# Patient Record
Sex: Male | Born: 1960 | Race: White | Hispanic: No | Marital: Single | Smoking: Never smoker
Health system: Southern US, Community
[De-identification: ages and names within clinical notes are randomized; demographics above are authoritative.]

## PROBLEM LIST (undated history)

## (undated) DIAGNOSIS — K2270 Barrett's esophagus without dysplasia: Secondary | ICD-10-CM

## (undated) DIAGNOSIS — F909 Attention-deficit hyperactivity disorder, unspecified type: Secondary | ICD-10-CM

## (undated) DIAGNOSIS — B1920 Unspecified viral hepatitis C without hepatic coma: Secondary | ICD-10-CM

## (undated) DIAGNOSIS — E785 Hyperlipidemia, unspecified: Secondary | ICD-10-CM

## (undated) DIAGNOSIS — S0990XA Unspecified injury of head, initial encounter: Secondary | ICD-10-CM

## (undated) DIAGNOSIS — K219 Gastro-esophageal reflux disease without esophagitis: Secondary | ICD-10-CM

## (undated) DIAGNOSIS — K5900 Constipation, unspecified: Secondary | ICD-10-CM

## (undated) HISTORY — DX: Constipation, unspecified: K59.00

## (undated) HISTORY — DX: Hyperlipidemia, unspecified: E78.5

## (undated) HISTORY — DX: Unspecified viral hepatitis C without hepatic coma: B19.20

## (undated) HISTORY — PX: ACHILLES TENDON REPAIR: SUR1153

## (undated) HISTORY — DX: Barrett's esophagus without dysplasia: K22.70

---

## 2011-06-15 ENCOUNTER — Emergency Department (HOSPITAL_COMMUNITY)

## 2011-06-15 ENCOUNTER — Inpatient Hospital Stay (HOSPITAL_COMMUNITY)
Admission: EM | Admit: 2011-06-15 | Discharge: 2011-06-16 | DRG: 069 | Attending: Internal Medicine | Admitting: Internal Medicine

## 2011-06-15 ENCOUNTER — Other Ambulatory Visit: Payer: Self-pay

## 2011-06-15 ENCOUNTER — Encounter (HOSPITAL_COMMUNITY): Payer: Self-pay | Admitting: *Deleted

## 2011-06-15 DIAGNOSIS — K219 Gastro-esophageal reflux disease without esophagitis: Secondary | ICD-10-CM | POA: Diagnosis present

## 2011-06-15 DIAGNOSIS — R279 Unspecified lack of coordination: Secondary | ICD-10-CM | POA: Diagnosis present

## 2011-06-15 DIAGNOSIS — R270 Ataxia, unspecified: Secondary | ICD-10-CM

## 2011-06-15 DIAGNOSIS — G459 Transient cerebral ischemic attack, unspecified: Principal | ICD-10-CM | POA: Diagnosis present

## 2011-06-15 DIAGNOSIS — Z23 Encounter for immunization: Secondary | ICD-10-CM

## 2011-06-15 DIAGNOSIS — R209 Unspecified disturbances of skin sensation: Secondary | ICD-10-CM | POA: Diagnosis present

## 2011-06-15 DIAGNOSIS — F909 Attention-deficit hyperactivity disorder, unspecified type: Secondary | ICD-10-CM | POA: Diagnosis present

## 2011-06-15 DIAGNOSIS — R51 Headache: Secondary | ICD-10-CM | POA: Diagnosis present

## 2011-06-15 HISTORY — DX: Gastro-esophageal reflux disease without esophagitis: K21.9

## 2011-06-15 HISTORY — DX: Attention-deficit hyperactivity disorder, unspecified type: F90.9

## 2011-06-15 HISTORY — DX: Unspecified injury of head, initial encounter: S09.90XA

## 2011-06-15 LAB — RAPID URINE DRUG SCREEN, HOSP PERFORMED
Amphetamines: NOT DETECTED
Barbiturates: NOT DETECTED
Tetrahydrocannabinol: NOT DETECTED

## 2011-06-15 LAB — DIFFERENTIAL
Basophils Absolute: 0 10*3/uL (ref 0.0–0.1)
Basophils Relative: 0 % (ref 0–1)
Eosinophils Relative: 0 % (ref 0–5)
Lymphocytes Relative: 16 % (ref 12–46)
Lymphs Abs: 1.3 10*3/uL (ref 0.7–4.0)
Neutro Abs: 6.7 10*3/uL (ref 1.7–7.7)
Neutrophils Relative %: 78 % — ABNORMAL HIGH (ref 43–77)

## 2011-06-15 LAB — COMPREHENSIVE METABOLIC PANEL
Albumin: 4.3 g/dL (ref 3.5–5.2)
BUN: 14 mg/dL (ref 6–23)
Calcium: 10.2 mg/dL (ref 8.4–10.5)
GFR calc Af Amer: >90 mL/min (ref >90)
Glucose, Bld: 114 mg/dL — ABNORMAL HIGH (ref 70–99)
Total Protein: 8.7 g/dL — ABNORMAL HIGH (ref 6.0–8.3)

## 2011-06-15 LAB — PROTIME-INR
INR: 1.1 (ref 0.00–1.49)
Prothrombin Time: 14.4 seconds (ref 11.6–15.2)

## 2011-06-15 LAB — APTT: aPTT: 29 seconds (ref 24–37)

## 2011-06-15 LAB — CBC
Hemoglobin: 15.9 g/dL (ref 13.0–17.0)
MCH: 31.9 pg (ref 26.0–34.0)
Platelets: 197 10*3/uL (ref 150–400)
RBC: 4.98 MIL/uL (ref 4.22–5.81)
WBC: 8.6 10*3/uL (ref 4.0–10.5)

## 2011-06-15 LAB — CARDIAC PANEL(CRET KIN+CKTOT+MB+TROPI)
CK, MB: 2.8 ng/mL (ref 0.3–4.0)
Total CK: 265 U/L — ABNORMAL HIGH (ref 7–232)

## 2011-06-15 MED ORDER — FAMOTIDINE 20 MG PO TABS
20.0000 mg | ORAL_TABLET | Freq: Two times a day (BID) | ORAL | Status: DC
  Administered 2011-06-15 – 2011-06-16 (×3): 20 mg via ORAL
  Filled 2011-06-15 (×3): qty 1

## 2011-06-15 MED ORDER — ONDANSETRON HCL 4 MG PO TABS: 4.0000 mg | ORAL_TABLET | Freq: Four times a day (QID) | ORAL | Status: DC | PRN

## 2011-06-15 MED ORDER — IBUPROFEN 800 MG PO TABS
800.0000 mg | ORAL_TABLET | Freq: Once | ORAL | Status: AC
  Administered 2011-06-15: 800 mg via ORAL
  Filled 2011-06-15: qty 1

## 2011-06-15 MED ORDER — ASPIRIN 325 MG PO TABS
325.0000 mg | ORAL_TABLET | Freq: Every day | ORAL | Status: DC
  Administered 2011-06-15 – 2011-06-16 (×2): 325 mg via ORAL
  Filled 2011-06-15 (×2): qty 1

## 2011-06-15 MED ORDER — SODIUM CHLORIDE 0.9 % IJ SOLN
INTRAMUSCULAR | Status: AC
  Administered 2011-06-15: 10 mL
  Filled 2011-06-15: qty 6

## 2011-06-15 MED ORDER — ONDANSETRON HCL 4 MG/2ML IJ SOLN: 4.0000 mg | Freq: Four times a day (QID) | INTRAMUSCULAR | Status: DC | PRN

## 2011-06-15 MED ORDER — HEPARIN SODIUM (PORCINE) 5000 UNIT/ML IJ SOLN
5000.0000 [IU] | Freq: Three times a day (TID) | INTRAMUSCULAR | Status: DC
  Administered 2011-06-15 – 2011-06-16 (×3): 5000 [IU] via SUBCUTANEOUS
  Filled 2011-06-15 (×7): qty 1

## 2011-06-15 MED ORDER — SODIUM CHLORIDE 0.9 % IV SOLN
INTRAVENOUS | Status: DC
  Administered 2011-06-15: 20 mL/h via INTRAVENOUS
  Administered 2011-06-15: 14:00:00 via INTRAVENOUS

## 2011-06-15 MED ORDER — ACETAMINOPHEN 325 MG PO TABS
650.0000 mg | ORAL_TABLET | Freq: Four times a day (QID) | ORAL | Status: DC | PRN
  Administered 2011-06-15: 650 mg via ORAL
  Filled 2011-06-15 (×2): qty 2

## 2011-06-15 NOTE — H&P (Signed)
Skip Litke MRN: [id number] DOB/AGE: 1961-04-26 51 y.o.  Admit date: 06/15/2011 Chief Complaint: Right arm numbness, unsteadiness of gait. HPI: This 51 year old man, who is currently in prison, presents with the above symptoms which started at 2:45 AM today. He noticed his gait was unsteady and he was veering to the right side. Also, he noticed some numbness in the right arm although did not describe frank weakness. He also describes some abnormal sensation in the back of the right head. There was no blurred vision or double vision and his speech was normal. He describes as if he was feeling drunk. His symptoms lasted for the next 3-4 hours and now have resolved. His workup in the emergency room so far shows a normal MRI with no evidence of CVA. He does not really have any risk factors for cerebrovascular disease.    1. Past medical history: 1. Gastroesophageal reflux disease. 2. Attention deficit disorder with hyperactivity.  Past surgical history: 1. Achilles tendon repair.      Family history: Noncontributory. In particular, there is no family history of cerebrovascular or cardiovascular disease.  Social history: He is currently in prison. He does not smoke cigarettes nor does he drink alcohol. He is married.  Allergies:  Allergies  Allergen Reactions  . Other     "cough med that starts with a c"    Medications Prior to Admission  Medication Dose Route Frequency Provider Last Rate Last Dose  . 0.9 %  sodium chloride infusion   Intravenous Continuous Candis Musa, PA 20 mL/hr at 06/15/11 0853 20 mL/hr at 06/15/11 0853  . aspirin tablet 325 mg  325 mg Oral Daily Candis Musa, PA   325 mg at 06/15/11 0854  . famotidine (PEPCID) tablet 20 mg  20 mg Oral BID Gazella Anglin C Nioma Mccubbins, MD      . heparin injection 5,000 Units  5,000 Units Subcutaneous Q8H Lynda Wanninger C Jamil Castillo, MD      . ibuprofen (ADVIL,MOTRIN) tablet 800 mg  800 mg Oral Once Candis Musa, PA   800 mg at 06/15/11 1114  .  ondansetron (ZOFRAN) tablet 4 mg  4 mg Oral Q6H PRN Mila Pair C Colburn Asper, MD       Or  . ondansetron (ZOFRAN) injection 4 mg  4 mg Intravenous Q6H PRN Katalin Colledge Normajean Glasgow, MD       No current outpatient prescriptions on file as of 06/15/2011.       NWG:NFAOZ from the symptoms mentioned above,there are no other symptoms referable to all systems reviewed.  Physical Exam: Blood pressure 124/52, pulse 60, temperature 97.9 F (36.6 C), temperature source Oral, resp. rate 20, height 5\' 11"  (1.803 m), weight 95.255 kg (210 lb), SpO2 97.00%. He looks systemically well and is physically well built and looks extremely fit. Heart sounds are present and normal without murmurs. There are no carotid bruits. Lung fields are clear. Abdomen is soft nontender nontender. Neurologically, he is alert and orientated. His cognition is completely normal. His speech is normal. External ocular movements are normal. There are no cranial nerve abnormalities. There are no cerebellar signs. His strength in both upper and lower limbs are normal.    Basename 06/15/11 0842  WBC 8.6  NEUTROABS 6.7  HGB 15.9  HCT 44.4  MCV 89.2  PLT 197    Basename 06/15/11 0842  NA 138  K 4.3  CL 102  CO2 28  GLUCOSE 114*  BUN 14  CREATININE 0.97  CALCIUM 10.2  MG --  Mr Brain Wo Contrast  06/15/2011  *RADIOLOGY REPORT*  Clinical Data: 51 year old male with right-sided weakness, numbness.  MRI HEAD WITHOUT CONTRAST  Technique:  Multiplanar, multiecho pulse sequences of the brain and surrounding structures were obtained according to standard protocol without intravenous contrast.  Comparison: None.  Findings: No restricted diffusion to suggest acute infarction.  No midline shift, mass effect, evidence of mass lesion, ventriculomegaly, extra-axial collection or acute intracranial hemorrhage.  Cervicomedullary junction and pituitary are within normal limits.  Major intracranial vascular flow voids are preserved. Wallace Cullens and  white matter signal is within normal limits for age throughout the brain.  Negative visualized cervical spine.  Visualized bone marrow signal is within normal limits.  Visualized paranasal sinuses and mastoids are clear.  Visualized orbit soft tissues are within normal limits.  Negative scalp soft tissues.  IMPRESSION: No acute intracranial abnormality.  Negative for age noncontrast MRI appearance the brain.  Original Report Authenticated By: Harley Hallmark, M.D.   Impression: 1. Possible TIA affecting the left brain versus cerebellum. MRI brain scan entirely normal.     Plan: 1. Admit. 2. 2-D echocardiogram and ultrasound carotids. 3. Hopefully, he can be discharged soon once the above workup is complete.      Wilson Singer Pager [phone]  06/15/2011, 12:48 PM

## 2011-06-15 NOTE — ED Notes (Signed)
Woke up with numbness to head and right side with difficulty walking. Had a cup of coffee, vomited and one episode of loose stool. Pt received dramamine, tylenol, and a CTM. Prior to arrival to ED.  Has sinus trouble often per pt.

## 2011-06-15 NOTE — ED Provider Notes (Signed)
Patient's symptoms are concerning for a TIA. At this time he has a normal neurologic exam. He has equal strength. There are no cranial nerve deficits noted. Patient has normal sensation.his MRI does not show an acute stroke I will consult the medicine service for admission for an expedited TIA workup. Patient currently is incarcerated and it made it difficult for him to have an outpatient workup.  Medical screening examination/treatment/procedure(s) were conducted as a shared visit with non-physician practitioner(s) and myself.  I personally evaluated the patient during the encounter   Celene Kras, MD 06/15/11 1023

## 2011-06-15 NOTE — ED Provider Notes (Signed)
History     CSN: [id number]  Arrival date & time 06/15/11  7829   First MD Initiated Contact with Patient 06/15/11 [phone]      Chief Complaint  Patient presents with  . Numbness    (Consider location/radiation/quality/duration/timing/severity/associated sxs/prior treatment) HPI Comments: Patient presents for further evaluation of numbness in his right upper extremity and his posterior neck and head which he first noticed when he woke at 245.  This morning.  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  He got out of bed and felt dizzy, so laid back down for a few minutes without improvement.  He proceeded to walk down the hallway and noticed he was leaning towards the right and felt extremely dizzy.  He reports feeling "like I was drunk.".  He also had a mild posterior headache which has been persistent.  He had one episode of nausea with emesis and also had a large watery bowel movement about 2 hours after his symptoms began.  His nausea has resolved, and his only remaining symptom at this time is numbness across his posterior scalp and a mild posterior headache.  He was given Dramamine prior to arrival.  The dizziness lasted for more than 2 hours.  He denies any prior similar symptoms.  The history is provided by the patient.    Past Medical History  Diagnosis Date  . GERD (gastroesophageal reflux disease)   . ADD (attention deficit disorder with hyperactivity)   . Head injury     Past Surgical History  Procedure Date  . Achilles tendon repair     No family history on file.  History  Substance Use Topics  . Smoking status: Never Smoker   . Smokeless tobacco: Not on file  . Alcohol Use: No      Review of Systems  Constitutional: Negative for fever.  HENT: Negative for congestion, sore throat and neck pain.   Eyes: Negative.   Respiratory: Negative for chest tightness and shortness of breath.   Cardiovascular: Negative for  chest pain.  Gastrointestinal: Negative for nausea and abdominal pain.  Genitourinary: Negative.   Musculoskeletal: Negative for joint swelling and arthralgias.  Skin: Negative.  Negative for rash and wound.  Neurological: Positive for dizziness and numbness. Negative for seizures, speech difficulty, weakness, light-headedness and headaches.  Hematological: Negative.   Psychiatric/Behavioral: Negative.     Allergies  Other  Home Medications  No current outpatient prescriptions on file.  BP 122/60  Pulse 55  Temp(Src) 97.5 F (36.4 C) (Oral)  Resp 16  Ht 5\' 11"  (1.803 m)  Wt 210 lb (95.255 kg)  BMI 29.29 kg/m2  SpO2 97%  Physical Exam  Nursing note and vitals reviewed. Constitutional: He is oriented to person, place, and time. He appears well-developed and well-nourished.  HENT:  Head: Normocephalic and atraumatic.  Eyes: Conjunctivae are normal.  Neck: Normal range of motion.  Cardiovascular: Normal rate, regular rhythm, normal heart sounds and intact distal pulses.   Pulmonary/Chest: Effort normal and breath sounds normal. He has no wheezes.  Abdominal: Soft. Bowel sounds are normal. There is no tenderness.  Musculoskeletal: Normal range of motion.  Neurological: He is alert and oriented to person, place, and time. He has normal strength and normal reflexes. He displays normal reflexes. No cranial nerve deficit or sensory deficit. He exhibits normal muscle tone. He displays a negative Romberg sign. Coordination and gait normal. GCS eye subscore is 4. GCS  verbal subscore is 5. GCS motor subscore is 6.       Normal rapid alternating movements.  Patient ambulated with no evidence of ataxia.  Skin: Skin is warm and dry.  Psychiatric: He has a normal mood and affect.    ED Course  Procedures (including critical care time)   Labs Reviewed  COMPREHENSIVE METABOLIC PANEL  PROTIME-INR  APTT  CARDIAC PANEL(CRET KIN+CKTOT+MB+TROPI)  URINE RAPID DRUG SCREEN (HOSP PERFORMED)    CBC  DIFFERENTIAL   No results found.   No diagnosis found.    MDM  ABCD score of 4 given patient's symptoms and duration.  MRI, negative, cannot rule  out TIA.  At this time.  Recommended admission, which patient is agreeable to.     Date: 06/15/2011  Rate: 55  Rhythm: sinus bradycardia  QRS Axis: normal  Intervals: normal  ST/T Wave abnormalities: normal  Conduction Disutrbances:none  Narrative Interpretation:   Old EKG Reviewed: none available      Candis Musa, PA 06/15/11 1036

## 2011-06-15 NOTE — ED Provider Notes (Signed)
Medical screening examination/treatment/procedure(s) were performed by non-physician practitioner and as supervising physician I was immediately available for consultation/collaboration.   Celene Kras, MD 06/15/11 [phone]

## 2011-06-15 NOTE — ED Notes (Signed)
Guard sitting with patient

## 2011-06-15 NOTE — Progress Notes (Signed)
*  PRELIMINARY RESULTS* Echocardiogram 2D Echocardiogram has been performed.  Conrad  06/15/2011, 3:23 PM

## 2011-06-15 NOTE — ED Notes (Signed)
Pt states he had a feeling of being drunk this am. Pt states he has had this in past with a recurrent inner ear build up of wax.

## 2011-06-16 LAB — CBC
Platelets: 174 10*3/uL (ref 150–400)
RDW: 12.2 % (ref 11.5–15.5)
WBC: 5.4 10*3/uL (ref 4.0–10.5)

## 2011-06-16 LAB — COMPREHENSIVE METABOLIC PANEL
Alkaline Phosphatase: 63 U/L (ref 39–117)
BUN: 13 mg/dL (ref 6–23)
CO2: 29 mEq/L (ref 19–32)
Chloride: 104 mEq/L (ref 96–112)
GFR calc Af Amer: 88 mL/min — ABNORMAL LOW (ref >90)
GFR calc non Af Amer: 76 mL/min — ABNORMAL LOW (ref >90)
Glucose, Bld: 91 mg/dL (ref 70–99)
Potassium: 4.2 mEq/L (ref 3.5–5.1)
Total Bilirubin: 0.6 mg/dL (ref 0.3–1.2)
Total Protein: 7.4 g/dL (ref 6.0–8.3)

## 2011-06-16 LAB — LIPID PANEL
Cholesterol: 141 mg/dL (ref 0–200)
HDL: 35 mg/dL — ABNORMAL LOW (ref >39)
Triglycerides: 91 mg/dL (ref <150)

## 2011-06-16 MED ORDER — ASPIRIN 325 MG PO TABS: 325.0000 mg | ORAL_TABLET | Freq: Every day | ORAL | Status: AC

## 2011-06-16 MED ORDER — INFLUENZA VIRUS VACC SPLIT PF IM SUSP
0.5000 mL | INTRAMUSCULAR | Status: DC
  Filled 2011-06-16: qty 0.5

## 2011-06-16 NOTE — Progress Notes (Signed)
CARE MANAGEMENT NOTE 06/16/2011  Patient:  Rick Livingston, Rick Livingston   Account Number:  [id number]  Date Initiated:  06/16/2011  Documentation initiated by:  Rosemary Holms  Subjective/Objective Assessment:   pt admitted with TIA's. PTA lived in correctional facility.     Action/Plan:   To be discharged back to correctional facility with no needs identified.   Anticipated DC Date:  06/16/2011   Anticipated DC Plan:  [facility]      DC Planning Services  CM consult      Choice offered to / List presented to:             Status of service:  Completed, signed off Medicare Important Message given?   (If response is "NO", the following Medicare IM given date fields will be blank) Date Medicare IM given:   Date Additional Medicare IM given:    Discharge Disposition:  [facility]  Per UR Regulation:    Comments:  06/16/11 1200 Hopelynn Gartland Leanord Hawking RN [facility] of corrections notified of DC.

## 2011-06-16 NOTE — Discharge Summary (Signed)
Physician Discharge Summary  Patient ID: Rick Livingston MRN: [id number] DOB/AGE: [birth year] 51 y.o.  Admit date: 06/15/2011 Discharge date: 06/16/2011  Primary Care Physician:  No primary provider on file., patient is in a correctional facility   Discharge Diagnoses:    Principal Problem:  *TIA (transient ischemic attack) GERD   Medication List  As of 06/16/2011  9:52 AM   STOP taking these medications         ibuprofen 200 MG tablet         TAKE these medications         aspirin 325 MG tablet   Take 1 tablet (325 mg total) by mouth daily.      mulitivitamin with minerals Tabs   Take 1 tablet by mouth daily.      ranitidine 150 MG tablet   Commonly known as: ZANTAC   Take 150 mg by mouth 2 (two) times daily as needed. For acid reflux             Disposition and Follow-up:  Follow up with MD at correctional facility  Consults:  none   Significant Diagnostic Studies:  Mr Brain Wo Contrast  06/15/2011  *RADIOLOGY REPORT*  Clinical Data: 51 year old male with right-sided weakness, numbness.  MRI HEAD WITHOUT CONTRAST  Technique:  Multiplanar, multiecho pulse sequences of the brain and surrounding structures were obtained according to standard protocol without intravenous contrast.  Comparison: None.  Findings: No restricted diffusion to suggest acute infarction.  No midline shift, mass effect, evidence of mass lesion, ventriculomegaly, extra-axial collection or acute intracranial hemorrhage.  Cervicomedullary junction and pituitary are within normal limits.  Major intracranial vascular flow voids are preserved. Wallace Cullens and white matter signal is within normal limits for age throughout the brain.  Negative visualized cervical spine.  Visualized bone marrow signal is within normal limits.  Visualized paranasal sinuses and mastoids are clear.  Visualized orbit soft tissues are within normal limits.  Negative scalp soft tissues.  IMPRESSION: No acute intracranial abnormality.   Negative for age noncontrast MRI appearance the brain.  Original Report Authenticated By: Harley Hallmark, M.D.   US Carotid Duplex Bilateral  06/15/2011  *RADIOLOGY REPORT*  Clinical Data: TIA, right-sided weakness.  BILATERAL CAROTID DUPLEX ULTRASOUND  Technique: Wallace Cullens scale imaging, color Doppler and duplex ultrasound was performed of bilateral carotid and vertebral arteries in the neck.  Comparison: None  Criteria:  Quantification of carotid stenosis is based on velocity parameters that correlate the residual internal carotid diameter with NASCET-based stenosis levels, using the diameter of the distal internal carotid lumen as the denominator for stenosis measurement.  The following velocity measurements were obtained:                   PEAK SYSTOLIC/END DIASTOLIC RIGHT ICA:                          69/22 cm/sec CCA:                        86/16cm/sec SYSTOLIC ICA/CCA RATIO:     0.81 DIASTOLIC ICA/CCA RATIO:    1.41 ECA:                        100cm/sec  LEFT ICA:                        64/30cm/sec CCA:  85/17cm/sec SYSTOLIC ICA/CCA RATIO:     1.38 DIASTOLIC ICA/CCA RATIO:    1.06 ECA:                        114cm/sec  Findings:  RIGHT CAROTID ARTERY: Intimal thickening in the common carotid artery bulb.  No focal plaque accumulation or stenosis.  Normal wave forms and color Doppler signal.  RIGHT VERTEBRAL ARTERY:  Normal flow direction and waveform.  LEFT CAROTID ARTERY: Mild smooth intimal thickening in the common carotid artery bulb.  No focal plaque accumulation or stenosis. Normal wave forms and color Doppler signal.  LEFT VERTEBRAL ARTERY:  Normal flow direction and waveform.  IMPRESSION:  1.  No significant carotid bifurcation plaque or stenosis.  Original Report Authenticated By: Osa Craver, M.D.    Brief H and P: For complete details please refer to admission H and P, but in brief This 51 year old man, who is currently in prison, presents with the above symptoms  which started at 2:45 AM today. He noticed his gait was unsteady and he was veering to the right side. Also, he noticed some numbness in the right arm although did not describe frank weakness. He also describes some abnormal sensation in the back of the right head. There was no blurred vision or double vision and his speech was normal. He describes as if he was feeling drunk. His symptoms lasted for the next 3-4 hours and now have resolved. His workup in the emergency room so far shows a normal MRI with no evidence of CVA. He does not really have any risk factors for cerebrovascular disease.     Hospital Course:  Principal Problem:  *TIA (transient ischemic attack)  Patient was admitted to the hospital for TIA work up.  MRI of the brain was found to be negative.  Carotid ultrasound did not show any significant stenosis bilaterally.  2D echo has been done, with report currently pending.  Lipid panel did not show an elevated LDL. Clinically the patient reports that his symptoms have resolved.  He no longer has any right arm numbness and does not feel weak on this right side.  Patient has not been taking any regular aspirin.  We will start him on a daily aspirin for preventative measures.  He is normotensive, but will need his blood pressures periodically monitored.  He is felt stable for discharge back to the correctional facility today  Time spent on Discharge:  Signed: [facility] Pager: [phone] 06/16/2011, 9:52 AM

## 2011-06-16 NOTE — Discharge Instructions (Signed)
Transient Ischemic Attack A transient ischemic attack (TIA) is a "warning stroke" that causes stroke-like symptoms. Unlike a stroke, a TIA does not cause permanent damage to the brain. The symptoms of a TIA can happen very fast and do not last long. It is important to know the symptoms of a TIA and what to do. This can help prevent a major stroke or death. CAUSES   A TIA is caused by a temporary blockage in an artery in the brain or neck (carotid artery). The blockage does not allow the brain to get the blood supply it needs and can cause different symptoms. The blockage can be caused by either:   A blood clot.   Fatty buildup (plaque) in a neck or brain artery.  SYMPTOMS  TIA symptoms are the same as a stroke but are temporary. Symptoms can include sudden:  Numbness or weakness on one side of the body. Especially to the:   Face.   Arm.   Leg.   Trouble speaking, thinking, or confusion.   Change in vision, such as trouble seeing in one or both eyes.   Dizziness, loss of balance, or difficulty walking.   Severe headache.  ANY OF THESE SYMPTOMS MAY REPRESENT A SERIOUS PROBLEM THAT IS AN EMERGENCY. Do not wait to see if the symptoms will go away. Get medical help at once. Call your local emergency services (911 in [place] IMMEDIATELY. DO NOT drive yourself to the hospital. RISK FACTORS Risk factors can increase the risk of developing a TIA. These can include.   High blood pressure (hypertension).   High cholesterol (hyperlipidemia).   Heart disease (atherosclerosis).   Smoking.   Diabetes.   Abnormal heart rhythm (atrial fibrillation).   Family history of a stroke or heart attack.   Use of oral contraceptives (especially when combined with smoking).  DIAGNOSIS   A TIA can be diagnosed based on your:   Symptoms.   History.   Risk factors.   Tests that can help diagnose the symptoms of a TIA include:   CT or MRI scan. These tests can provide detailed images of the  brain.   Carotid ultrasound. This test looks to see if there are blockages in the carotid arteries of your neck.   Arteriography. A thin, small flexible tube (catheter) is inserted through a small cut (incision) in your groin. The catheter is threaded to your carotid or vertebral artery. A dye is then injected into the catheter. The dye highlights the arteries in your brain and allows your caregiver to look for narrowing or blockages that can cause a TIA.  TREATMENT  Based on the cause of a TIA, treatment options can vary. Treatment is important to help prevent a stroke. Treatment options can include:  Medication. Such as:   Clot-busting medicine.   Anti-platelet medicine.   Blood pressure medicine.   Blood thinner medicine.   Surgery:   Carotid endarterectomy. The carotid arteries are the arteries that supply the head and neck with oxygenated blood. This surgery can help remove fatty deposits (plaque) in the carotid arteries.   Angioplasty and stenting. This surgery uses a balloon to dilate a blocked artery in the brain. A stent is a small, metal mesh tube that can help keep an artery open  HOME CARE INSTRUCTIONS   It is important to take all medicine as told by your caregiver. If the medicine has side effects that affect you negatively, tell your caregiver right away. Do not stop taking medicine unless told   by your caregiver. Some medicines may need to be changed to better treat your condition.   Do not smoke. Talk to your caregiver on how to quit smoking.   Eat a diet high in fruits, vegetables and lean meat. Avoid a high fat, high salt diet. A dietician can you help you make healthy food choices.   Maintain a healthy weight. Develop an exercise plan approved by your caregiver.  SEEK IMMEDIATE MEDICAL CARE IF:   You develop weakness or numbness on one side of your body.   You have problems thinking, speaking, or feel confused.   You have vision changes.   You feel dizzy,  have trouble walking, or lose your balance.   You develop a severe headache.  MAKE SURE YOU:   Understand these instructions.   Will watch your condition.   Will get help right away if you are not doing well or get worse.  Document Released: 01/28/2005 Document Revised: 12/31/2010 Document Reviewed: 06/13/2009 [facility] Patient Information 2012 [place], [place].

## 2011-06-16 NOTE — Progress Notes (Signed)
[facility] to [facility] with [facility] D/c instructions sent with guards Left floor via wheelchair, accompanied by staff  No c/o pain at d/c  Patient will be followed by medical staff at prison [facility]. Rick Livingston

## 2012-08-30 ENCOUNTER — Emergency Department (HOSPITAL_BASED_OUTPATIENT_CLINIC_OR_DEPARTMENT_OTHER): Payer: BC Managed Care – PPO

## 2012-08-30 ENCOUNTER — Emergency Department (HOSPITAL_BASED_OUTPATIENT_CLINIC_OR_DEPARTMENT_OTHER)
Admission: EM | Admit: 2012-08-30 | Discharge: 2012-08-30 | Disposition: A | Discharge status: Home / Self Care | Payer: BC Managed Care – PPO | Attending: Emergency Medicine | Admitting: Emergency Medicine

## 2012-08-30 ENCOUNTER — Encounter (HOSPITAL_BASED_OUTPATIENT_CLINIC_OR_DEPARTMENT_OTHER): Payer: Self-pay | Admitting: *Deleted

## 2012-08-30 DIAGNOSIS — R209 Unspecified disturbances of skin sensation: Secondary | ICD-10-CM | POA: Insufficient documentation

## 2012-08-30 DIAGNOSIS — Z79899 Other long term (current) drug therapy: Secondary | ICD-10-CM | POA: Insufficient documentation

## 2012-08-30 DIAGNOSIS — Y9389 Activity, other specified: Secondary | ICD-10-CM | POA: Insufficient documentation

## 2012-08-30 DIAGNOSIS — K219 Gastro-esophageal reflux disease without esophagitis: Secondary | ICD-10-CM | POA: Insufficient documentation

## 2012-08-30 DIAGNOSIS — Y9289 Other specified places as the place of occurrence of the external cause: Secondary | ICD-10-CM | POA: Insufficient documentation

## 2012-08-30 DIAGNOSIS — W138XXA Fall from, out of or through other building or structure, initial encounter: Secondary | ICD-10-CM | POA: Insufficient documentation

## 2012-08-30 DIAGNOSIS — Z87828 Personal history of other (healed) physical injury and trauma: Secondary | ICD-10-CM | POA: Insufficient documentation

## 2012-08-30 DIAGNOSIS — W19XXXA Unspecified fall, initial encounter: Secondary | ICD-10-CM

## 2012-08-30 DIAGNOSIS — Z8659 Personal history of other mental and behavioral disorders: Secondary | ICD-10-CM | POA: Insufficient documentation

## 2012-08-30 DIAGNOSIS — S161XXA Strain of muscle, fascia and tendon at neck level, initial encounter: Secondary | ICD-10-CM

## 2012-08-30 DIAGNOSIS — Y990 Civilian activity done for income or pay: Secondary | ICD-10-CM | POA: Insufficient documentation

## 2012-08-30 DIAGNOSIS — S0990XA Unspecified injury of head, initial encounter: Secondary | ICD-10-CM | POA: Insufficient documentation

## 2012-08-30 DIAGNOSIS — S139XXA Sprain of joints and ligaments of unspecified parts of neck, initial encounter: Secondary | ICD-10-CM | POA: Insufficient documentation

## 2012-08-30 MED ORDER — OXYCODONE-ACETAMINOPHEN 5-325 MG PO TABS: 1.0000 | ORAL_TABLET | ORAL | Status: DC | PRN

## 2012-08-30 NOTE — ED Provider Notes (Signed)
History     CSN: [id number]  Arrival date & time 08/30/12  1550   First MD Initiated Contact with Patient 08/30/12 1556      Chief Complaint  Patient presents with  . Neck Pain     Patient is a 52 y.o. male presenting with neck pain. The history is provided by the patient.  Neck Pain Pain location:  L side Quality:  Aching Pain radiates to:  Does not radiate Pain severity:  Moderate Onset quality:  Sudden Duration: 1-2 hours ago. Timing:  Constant Progression:  Unchanged Chronicity:  New Context: fall   Relieved by:  Position Exacerbated by: movement, swallowing. Associated symptoms: headaches and numbness   Associated symptoms: no chest pain, no syncope and no weakness    Pt reports he fell about 10 feet while working.  He reports when he fell he landed on the back of his head and neck.  No LOC but he reports persistent pain in head and neck.  He reports he had immediate numbness in palms of both hands but that has improved.  He reports that it hurts to swallow but he is able to speak and handle secretions without difficulty.    He denies cp/sob.  No abd pain.  No leg pain is reported Past Medical History  Diagnosis Date  . GERD (gastroesophageal reflux disease)   . ADD (attention deficit disorder with hyperactivity)   . Head injury     Past Surgical History  Procedure Laterality Date  . Achilles tendon repair      No family history on file.  History  Substance Use Topics  . Smoking status: Never Smoker   . Smokeless tobacco: Not on file  . Alcohol Use: No      Review of Systems  HENT: Positive for neck pain.   Cardiovascular: Negative for chest pain and syncope.  Neurological: Positive for numbness and headaches. Negative for weakness.  All other systems reviewed and are negative.    Allergies  Other  Home Medications   Current Outpatient Rx  Name  Route  Sig  Dispense  Refill  . Multiple Vitamin (MULITIVITAMIN WITH MINERALS) TABS   Oral  Take 1 tablet by mouth daily.         . ranitidine (ZANTAC) 150 MG tablet   Oral   Take 150 mg by mouth 2 (two) times daily as needed. For acid reflux           BP 125/80  Pulse 55  Temp(Src) 98 F (36.7 C) (Oral)  Resp 20  SpO2 97%  Physical Exam CONSTITUTIONAL: Well developed/well nourished HEAD: Normocephalic/atraumatic EYES: EOMI/PERRL ENMT: Mucous membranes moist. No evidence of facial/nasal trauma. No stridor.  No drooling noted SPINE:  cervical spinal and paraspinal tenderness.  No bruising/crepitance/stepoffs noted to spine. Cervical collar in place CV: S1/S2 noted, no murmurs/rubs/gallops noted LUNGS: Lungs are clear to auscultation bilaterally, no apparent distress ABDOMEN: soft, nontender, no rebound or guarding GU:no cva tenderness NEURO: Pt is awake/alert, moves all extremitiesx4. GCS 15.  Equal hand grips noted, equal power with elbow flex/extension and shoulder abduction EXTREMITIES: pulses normal, full ROM. All other extremities/joints palpated/ranged and nontender SKIN: warm, color normal PSYCH: no abnormalities of mood noted  ED Course  Procedures 4:10 PM Pt seen on arrival.  He is here for fall that occurred at work.  He reports landing on neck and back.  He is in no distress but he does have significant neck pain.  His neck pain is  distracting him from the rest of his spinal exam, so will also image thoracic and lumbar spine.  We will keep him in full CTL precautions with c-collar in place.  He arrived by private car and was sitting in chair on my arrival to room.  He was helped to bed and kept in strict spinal precautions   No fracture noted by CT imaging Pt has no neuro deficits in his upper or lower extremities Equal hand grips, equal elbow flex/extension and shoulder abduction He has no chest or abdominal tenderness I advised him to continue to wear cervical collar as ligamentous injury is not ruled out.  Also, he did have paresthesias when the  injury occurred but that has improved.  I advised no work until re-examined within 5 days and f/u info given He has no anterior neck soft tissue swelling/bruising.  He can swallow without difficulty.  He has mild pain at left mandibular angle but no bony tenderness, deformity or stridor is noted.  No thrill noted over carotid.  Ct imaging of cspine (d/w radiology) does not reveal any obvious soft tissue swelling Stable for d/c  MDM  Nursing notes including past medical history and social history reviewed and considered in documentation xrays reviewed and considered         Joya Gaskins, MD 08/30/12 1806

## 2012-08-30 NOTE — ED Notes (Signed)
Fell 10 feet through a metal roof landing on the back of his head onto dirt ground. His hands went numb immediatly. On arrival to ED he is having trouble swallowing. c collar applied.

## 2012-08-31 ENCOUNTER — Encounter: Payer: Self-pay | Admitting: Family Medicine

## 2012-08-31 ENCOUNTER — Ambulatory Visit (INDEPENDENT_AMBULATORY_CARE_PROVIDER_SITE_OTHER): Payer: BC Managed Care – PPO | Admitting: Family Medicine

## 2012-08-31 VITALS — BP 110/74 | HR 78 | Ht 71.0 in | Wt 198.0 lb

## 2012-08-31 DIAGNOSIS — S199XXA Unspecified injury of neck, initial encounter: Secondary | ICD-10-CM

## 2012-09-01 ENCOUNTER — Encounter: Payer: Self-pay | Admitting: Family Medicine

## 2012-09-01 DIAGNOSIS — S199XXA Unspecified injury of neck, initial encounter: Secondary | ICD-10-CM | POA: Insufficient documentation

## 2012-09-01 NOTE — Assessment & Plan Note (Signed)
CT of cervical spine negative.  Of concern is that he had numbness in both hands when he struck the ground - concern for cervical spinal cord contusion vs transient spinal cord injury.  Advised he wear cervical collar at all times, will move forward with an MRI of cervical spine to assess spinal cord, patency of canal, ensure no ligamentous disruption.  Will call him with results.  If this is negative would consider PT, nsaids, muscle relaxants.

## 2012-09-01 NOTE — Progress Notes (Addendum)
Subjective:    Patient ID: Rick Livingston, male    DOB: [birth year], 52 y.o.   MRN: [id number]  PCP: None  HPI 52 yo M here for head, neck injuries.  Patient reports on 4/29 he was working on a metal roof about 10 feet off the ground. Part of the roof collapsed while he was crawling on it and he fell to ground onto back of head while head was tucked. Immediately had numbness/tingling in both hands for a couple seconds. No loss of consciousness, vomiting, vision changes, speech changes, other neurologic complaints. Felt like where he hit head was numb initially. Does have pain on left side of neck below jaw only when swallowing, feels better to press on this area. Went to ED on his own and had CTs of cervical, thoracic, lumbar spine that were negative for a fracture. Is wearing a cervical collar though has taken it off last night to bathe. Has continued to work. No bowel/bladder dysfunction.  Past Medical History  Diagnosis Date  . GERD (gastroesophageal reflux disease)   . ADD (attention deficit disorder with hyperactivity)   . Head injury     Current Outpatient Prescriptions on File Prior to Visit  Medication Sig Dispense Refill  . Multiple Vitamin (MULITIVITAMIN WITH MINERALS) TABS Take 1 tablet by mouth daily.      Marland Kitchen oxyCODONE-acetaminophen (PERCOCET/ROXICET) 5-325 MG per tablet Take 1 tablet by mouth every 4 (four) hours as needed for pain.  15 tablet  0  . ranitidine (ZANTAC) 150 MG tablet Take 150 mg by mouth 2 (two) times daily as needed. For acid reflux       No current facility-administered medications on file prior to visit.    Past Surgical History  Procedure Laterality Date  . Achilles tendon repair      Allergies  Allergen Reactions  . Other     "cough med that starts with a c"    History   Social History  . Marital Status: Married    Spouse Name: N/A    Number of Children: N/A  . Years of Education: N/A   Occupational History  . Not on file.    Social History Main Topics  . Smoking status: Never Smoker   . Smokeless tobacco: Not on file  . Alcohol Use: No  . Drug Use: No  . Sexually Active: Not on file   Other Topics Concern  . Not on file   Social History Narrative  . No narrative on file    Family History  Problem Relation Age of Onset  . Sudden death Neg Hx   . Hypertension Neg Hx   . Heart attack Neg Hx   . Diabetes Neg Hx   . Hyperlipidemia Neg Hx     BP 110/74  Pulse 78  Ht 5\' 11"  (1.803 m)  Wt 198 lb (89.812 kg)  BMI 27.63 kg/m2  Review of Systems See HPI above.    Objective:   Physical Exam Gen: NAD  Neuro: CN 2-12 grossly intact. EOMI. Pharynx symmetric. All extremities with 5/5 strength, reflexes 2+ upper and lower extremities. No numbness/tingling.  No laceration, contusion of back of skull or palpable deformity where he fell.  Neck: No gross deformity, swelling, bruising. TTP paraspinal muscles.  No focal bony tenderness. Did not test ROM of neck. BUE strength 5/5.   Sensation intact to light touch.   2+ equal reflexes in triceps, biceps, brachioradialis tendons. NV intact distal BUEs.    Assessment &  Plan:  1. Neck injury - CT of cervical spine negative.  Of concern is that he had numbness in both hands when he struck the ground - concern for cervical spinal cord contusion vs transient spinal cord injury.  Advised he wear cervical collar at all times, will move forward with an MRI of cervical spine to assess spinal cord, patency of canal, ensure no ligamentous disruption.  Will call him with results.  If this is negative would consider PT, nsaids, muscle relaxants.  Addendum 5/5:  Patient's MRI report reviewed and discussed with patient.  He doesn't have evidence of ligamentous disruption, central cord narrowing, increased signal of spinal cord that would be related to his injury to cause symptoms into both hands.  He does however have a disc protrusion at C5-6 but to the right side.   Neurologically he's intact currently.  Declined PT, nsaids, other medication for now and will call us if he doesn't continue to improve over next few weeks.  Discontinue cervical collar - only use as needed.  Will scan MRI report into chart.

## 2012-09-01 NOTE — Patient Instructions (Addendum)
Get MRI tomorrow as directed. I will call you with results and how to proceed.

## 2013-03-22 IMAGING — US US CAROTID DUPLEX BILAT
1 series · 13 of 24 positions shown · non-contrast
Comparison: None

CLINICAL DATA: TIA, right-sided weakness.

BILATERAL CAROTID DUPLEX ULTRASOUND
TECHNIQUE: Gray scale imaging, color Doppler and duplex ultrasound
was performed of bilateral carotid and vertebral arteries in the
neck.

[Series 1: us carotid duplex bilat · 0.07mm/px · 13 of 69 slices shown]
[im 1/69]
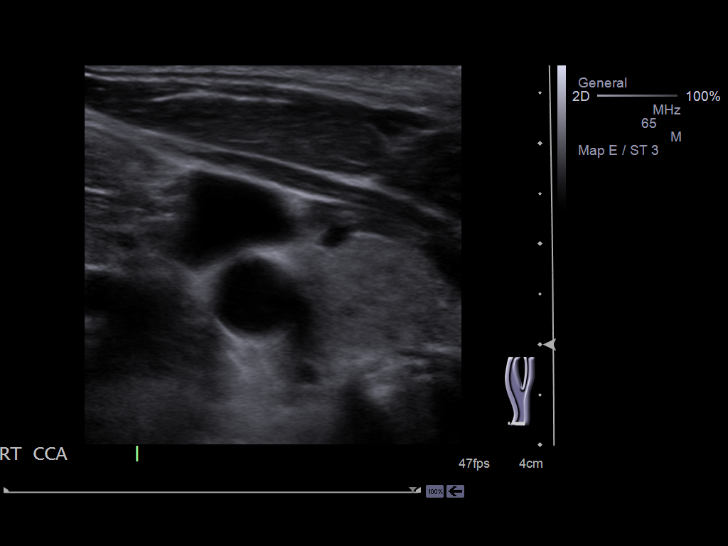
[im 6/69]
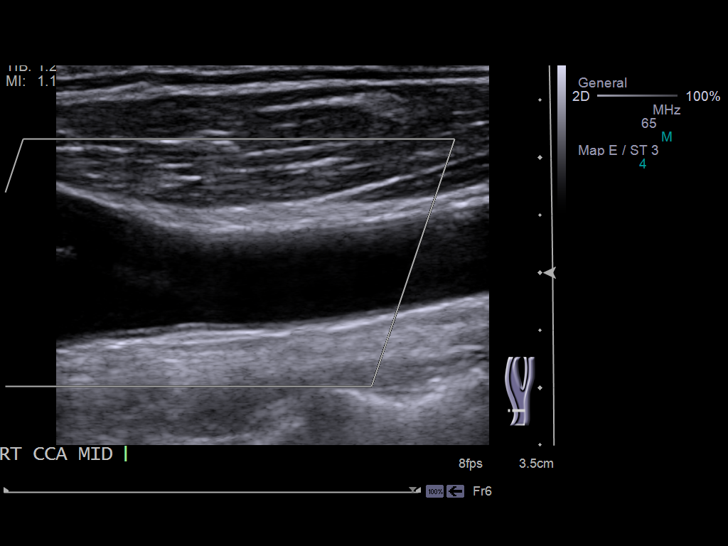
[im 12/69]
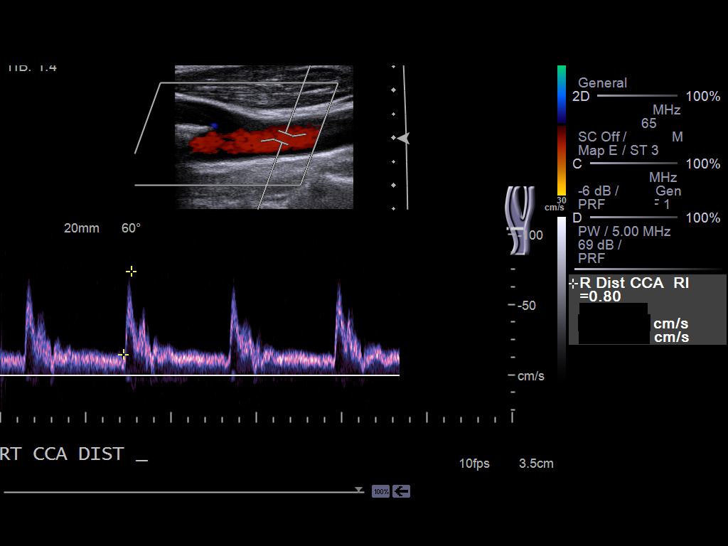
[im 18/69]
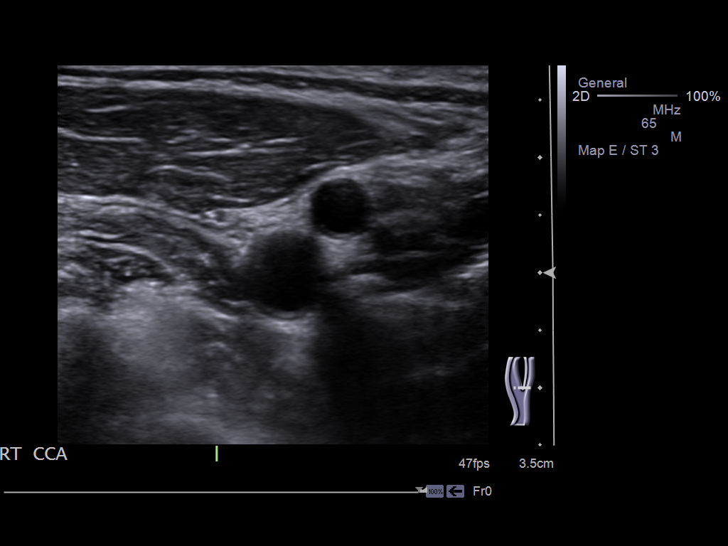
[im 24/69]
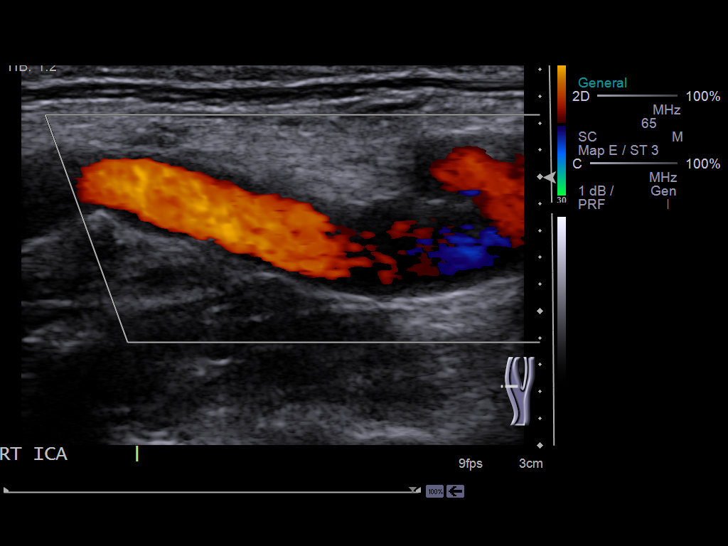
[im 30/69]
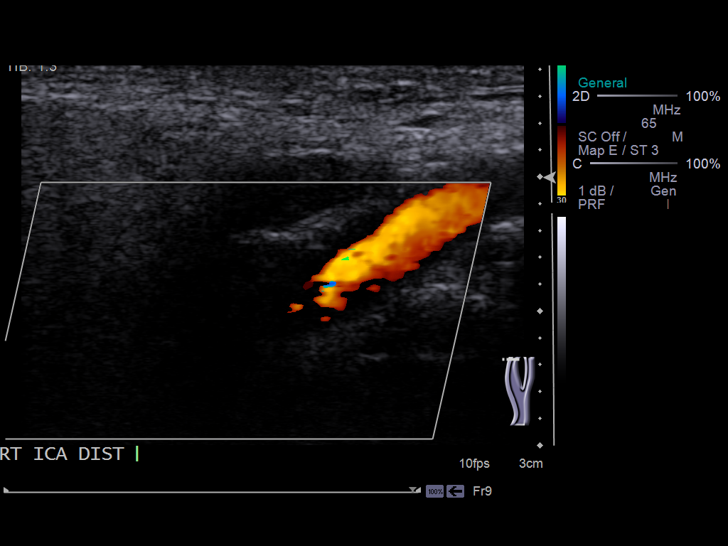
[im 36/69]
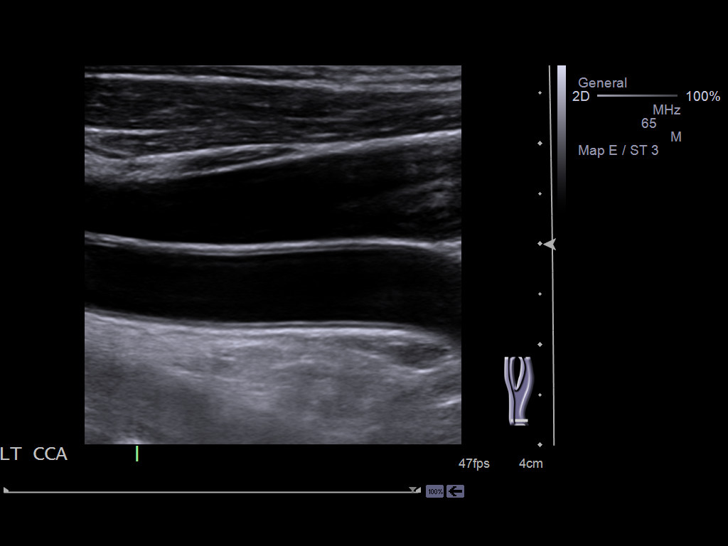
[im 39/69]
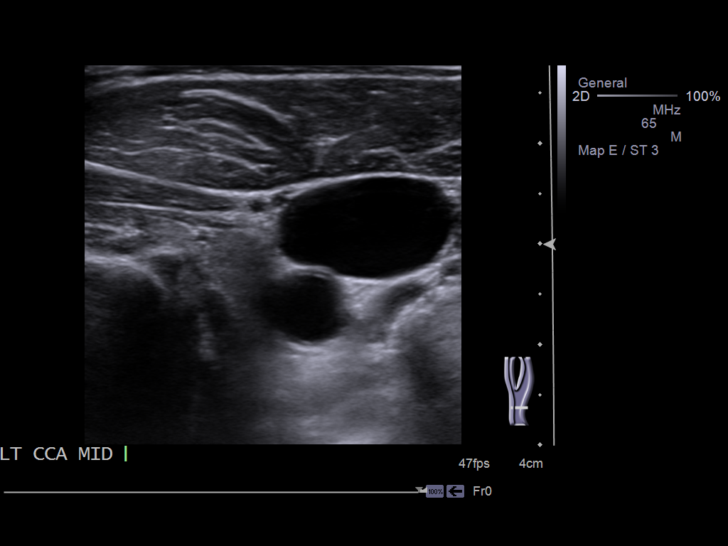
[im 45/69]
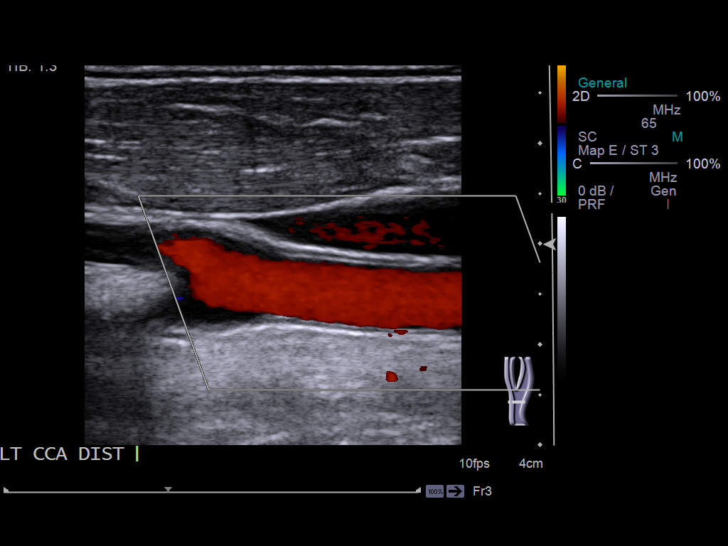
[im 51/69]
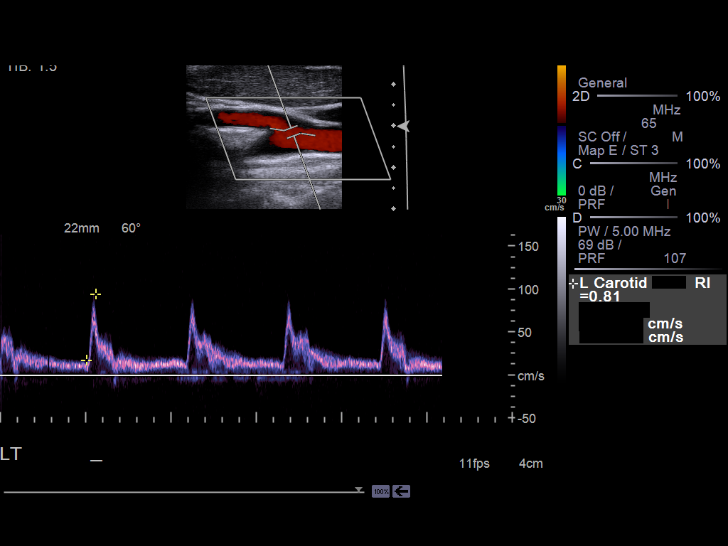
[im 57/69]
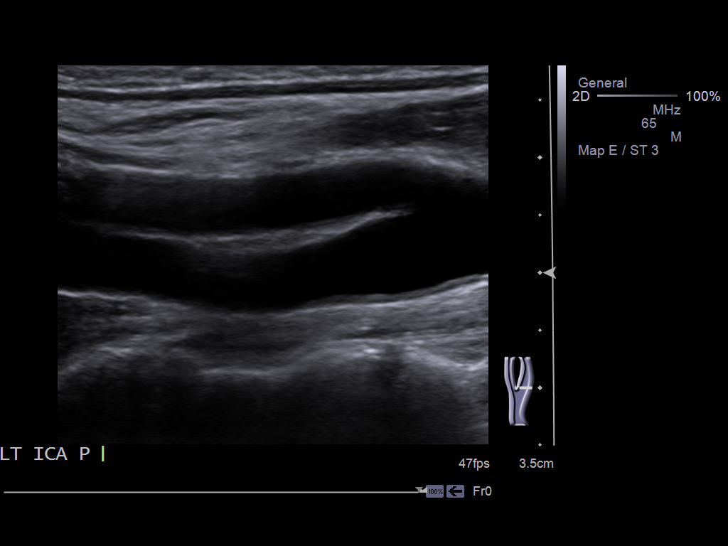
[im 63/69]
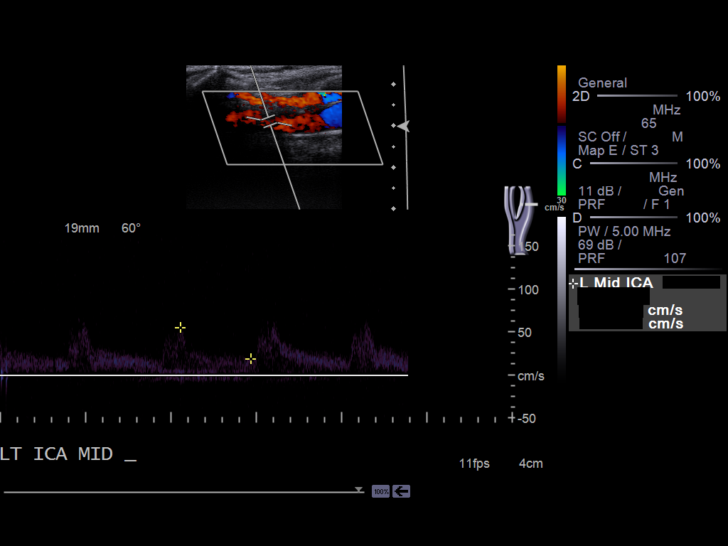
[im 69/69]
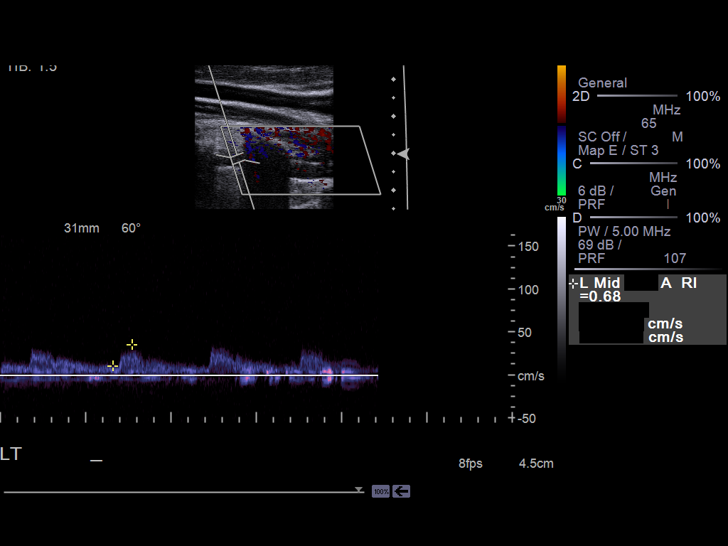

[13 of 24 positions shown; findings below may reference images not displayed]

Criteria:  Quantification of carotid stenosis is based on velocity
parameters that correlate the residual internal carotid diameter
with NASCET-based stenosis levels, using the diameter of the distal
internal carotid lumen as the denominator for stenosis measurement.

The following velocity measurements were obtained:

                 PEAK SYSTOLIC/END DIASTOLIC
RIGHT
ICA:                          69/22 cm/sec
CCA:                        86/16cm/sec
SYSTOLIC ICA/CCA RATIO:
DIASTOLIC ICA/CCA RATIO:
ECA:                        100cm/sec

LEFT
ICA:                        64/30cm/sec
CCA:                        85/17cm/sec
SYSTOLIC ICA/CCA RATIO:
DIASTOLIC ICA/CCA RATIO:
ECA:                        114cm/sec
FINDINGS: RIGHT CAROTID ARTERY: Intimal thickening in the common carotid
artery bulb.  No focal plaque accumulation or stenosis.  Normal
wave forms and color Doppler signal.

RIGHT VERTEBRAL ARTERY:  Normal flow direction and waveform.

LEFT CAROTID ARTERY: Mild smooth intimal thickening in the common
carotid artery bulb.  No focal plaque accumulation or stenosis.
Normal wave forms and color Doppler signal.

LEFT VERTEBRAL ARTERY:  Normal flow direction and waveform.
IMPRESSION: 1.  No significant carotid bifurcation plaque or stenosis.

## 2015-05-24 ENCOUNTER — Emergency Department (HOSPITAL_COMMUNITY): Payer: BLUE CROSS/BLUE SHIELD

## 2015-05-24 ENCOUNTER — Encounter (HOSPITAL_COMMUNITY): Payer: Self-pay | Admitting: *Deleted

## 2015-05-24 ENCOUNTER — Emergency Department (HOSPITAL_COMMUNITY)
Admission: EM | Admit: 2015-05-24 | Discharge: 2015-05-24 | Disposition: A | Discharge status: Home / Self Care | Payer: BLUE CROSS/BLUE SHIELD | Attending: Emergency Medicine | Admitting: Emergency Medicine

## 2015-05-24 DIAGNOSIS — Y998 Other external cause status: Secondary | ICD-10-CM | POA: Insufficient documentation

## 2015-05-24 DIAGNOSIS — Z8659 Personal history of other mental and behavioral disorders: Secondary | ICD-10-CM | POA: Insufficient documentation

## 2015-05-24 DIAGNOSIS — K219 Gastro-esophageal reflux disease without esophagitis: Secondary | ICD-10-CM | POA: Insufficient documentation

## 2015-05-24 DIAGNOSIS — S81032A Puncture wound without foreign body, left knee, initial encounter: Secondary | ICD-10-CM | POA: Insufficient documentation

## 2015-05-24 DIAGNOSIS — Y9389 Activity, other specified: Secondary | ICD-10-CM | POA: Insufficient documentation

## 2015-05-24 DIAGNOSIS — Z79899 Other long term (current) drug therapy: Secondary | ICD-10-CM | POA: Insufficient documentation

## 2015-05-24 DIAGNOSIS — Z7982 Long term (current) use of aspirin: Secondary | ICD-10-CM | POA: Diagnosis not present

## 2015-05-24 DIAGNOSIS — W458XXA Other foreign body or object entering through skin, initial encounter: Secondary | ICD-10-CM | POA: Insufficient documentation

## 2015-05-24 DIAGNOSIS — Y9289 Other specified places as the place of occurrence of the external cause: Secondary | ICD-10-CM | POA: Insufficient documentation

## 2015-05-24 DIAGNOSIS — T148XXA Other injury of unspecified body region, initial encounter: Secondary | ICD-10-CM

## 2015-05-24 MED ORDER — CEPHALEXIN 500 MG PO CAPS: 500.0000 mg | ORAL_CAPSULE | Freq: Four times a day (QID) | ORAL | Status: DC

## 2015-05-24 NOTE — ED Provider Notes (Signed)
CSN: [id number]     Arrival date & time 05/24/15  1942 History  By signing my name below, I, Rick Livingston, attest that this documentation has been prepared under the direction and in the presence of [employer], PA-C. Electronically Signed: Placido Livingston, ED Scribe. 05/24/2015. 8:34 PM.    Chief Complaint  Patient presents with  . Puncture Wound  . Wound Infection   The history is provided by the patient. No language interpreter was used.    HPI Comments: Rick Livingston is a 55 y.o. male who presents to the Emergency Department complaining of a irritated puncture wound above his left knee that occurred 2 days ago. He notes that a 1 inch nail was sticking out of a wood plank which fell onto the affected region and punctured the skin. He notes associated, moderate, left knee pain, redness and difficulty moving the affected joint. He confirms his TDAP is UTD. Pt denies any other associated symptoms at this time.    Past Medical History  Diagnosis Date  . GERD (gastroesophageal reflux disease)   . ADD (attention deficit disorder with hyperactivity)   . Head injury    Past Surgical History  Procedure Laterality Date  . Achilles tendon repair     Family History  Problem Relation Age of Onset  . Sudden death Neg Hx   . Hypertension Neg Hx   . Heart attack Neg Hx   . Diabetes Neg Hx   . Hyperlipidemia Neg Hx    Social History  Substance Use Topics  . Smoking status: Never Smoker   . Smokeless tobacco: None  . Alcohol Use: No    Review of Systems  All other systems reviewed and are negative.   Allergies  Other  Home Medications   Prior to Admission medications   Medication Sig Start Date End Date Taking? Authorizing Provider  aspirin 81 MG chewable tablet Chew 81 mg by mouth daily.    Historical Provider, MD  Multiple Vitamin (MULITIVITAMIN WITH MINERALS) TABS Take 1 tablet by mouth daily.    Historical Provider, MD  oxyCODONE-acetaminophen  (PERCOCET/ROXICET) 5-325 MG per tablet Take 1 tablet by mouth every 4 (four) hours as needed for pain. 08/30/12   Zadie Rhine, MD  ranitidine (ZANTAC) 150 MG tablet Take 150 mg by mouth 2 (two) times daily as needed. For acid reflux    Historical Provider, MD   BP 125/75 mmHg  Pulse 66  Temp(Src) 97.9 F (36.6 C) (Oral)  Resp 20  Wt 198 lb (89.812 kg)  SpO2 97%    Physical Exam  Constitutional: He is oriented to person, place, and time. He appears well-developed and well-nourished. No distress.  HENT:  Head: Normocephalic and atraumatic.  Eyes: Conjunctivae are normal. Right eye exhibits no discharge. Left eye exhibits no discharge. No scleral icterus.  Cardiovascular: Normal rate.   Pulmonary/Chest: Effort normal.  Musculoskeletal: Normal range of motion.  Evidence of previous puncture wound on the anterior aspect of left knee. Mild redness surrounding the puncture wound. No edema. No streaking. No warmth. Patient is able to flex and extend left knee without difficulty. No sign of muscle or tendon rupture. Patient ambulatory without difficulty.  Neurological: He is alert and oriented to person, place, and time. Coordination normal.  Strength 5/5 throughout. No sensory deficits.  No gait abnormality.  Skin: Skin is warm and dry. No rash noted. He is not diaphoretic. No erythema. No pallor.  Psychiatric: He has a normal mood and affect. His  behavior is normal.  Nursing note and vitals reviewed.   ED Course  Procedures  DIAGNOSTIC STUDIES: Oxygen Saturation is 97% on RA, normal by my interpretation.    COORDINATION OF CARE: 8:33 PM Pt presents today due to an irritated puncture wound to his left knee. Discussed next steps with pt including a DG of the affected region and reevaluation based on results of the imaging. Pt agreed to plan.  Labs Review Labs Reviewed - No data to display  Imaging Review Dg Knee Complete 4 Views Left  05/24/2015  CLINICAL DATA:  Puncture wound to  left knee 2 days ago EXAM: LEFT KNEE - COMPLETE 4+ VIEW COMPARISON:  None. FINDINGS: Four views of the left knee submitted. No acute fracture or subluxation. Mild prepatellar soft tissue swelling. No radiopaque foreign body. IMPRESSION: No acute fracture or subluxation. Mild prepatellar soft tissue swelling. Electronically Signed   By: Natasha Mead M.D.   On: 05/24/2015 21:20   I have personally reviewed and evaluated these images as part of my medical decision-making.   EKG Interpretation None      MDM   Final diagnoses:  Puncture wound   Otherwise healthy 55 year old male presents for puncture wound on left anterior knee that occurred 2 days ago after a wooden plank with a nail fell on his left knee. She has mild erythema around puncture wound site. Patient able to flex and extend his knee without difficulty. No obvious swelling or edema. Doubt septic joint. Patient afebrile. Knee is not warm or hot to the touch. No streaking. Patient's tetanus is up-to-date. X-ray obtained which reveals no acute fracture or subluxation. There is mild prepatellar soft tissue swelling which is consistent with clinical exam. No foreign body seen. We'll discharge home with antibiotics and pain medication. She will follow up with PCP this week for reevaluation. Return precautions outlined in patient discharge instructions. Patient is hemodynamically stable and ready for discharge.  I personally performed the services described in this documentation, which was scribed in my presence. The recorded information has been reviewed and is accurate.     Lester Kinsman [place], PA-C 05/25/15 2317  Richardean Canal, MD 05/27/15 [phone]

## 2015-05-24 NOTE — ED Notes (Signed)
Patient transported to x-ray. ?

## 2015-05-24 NOTE — ED Notes (Signed)
Patient reports a wood plank falling down on the top of his L knee. Wood plank had a 16 pinning nail in it, patient states about a inch went into the top of his knee, just above the kneecap. Patient still has full mobility of leg and knee, but reports pain with flexion and extension. Knee shows signs of infections - redness and slight swelling around the nail insertion.

## 2015-05-24 NOTE — Discharge Instructions (Signed)
Follow-up with her primary care provider for reevaluation. Apply ice to affected area. Take anabiotic as prescribed. Take ibuprofen as needed for pain. Return to the emergency department if you notice redness or swelling around the wound, and ability to flex knee, severe increasing ear pain, fever

## 2015-05-24 NOTE — ED Notes (Signed)
Patient verbalized understanding of discharge instructions and denies any further needs or questions at this time. VS stable. Patient ambulatory with steady gait.  

## 2015-05-24 NOTE — ED Notes (Signed)
Pt states he had a nail go into his left leg just above the left knee two days ago. Pt finished his work yesterday and worked all day today. Pt now presents with redness and tenderness, no drainage noted. Last tetanus in December

## 2015-07-10 HISTORY — PX: COLONOSCOPY: SHX174

## 2016-11-23 HISTORY — PX: ESOPHAGOGASTRODUODENOSCOPY: SHX1529

## 2017-12-07 ENCOUNTER — Telehealth: Payer: Self-pay | Admitting: Gastroenterology

## 2017-12-07 MED ORDER — DEXLANSOPRAZOLE 60 MG PO CPDR: 60.0000 mg | DELAYED_RELEASE_CAPSULE | Freq: Every day | ORAL | 0 refills | Status: DC

## 2017-12-07 NOTE — Telephone Encounter (Signed)
Sent refill to patients pharmacy and made patient appointment for additional refills.

## 2017-12-31 ENCOUNTER — Encounter: Payer: Self-pay | Admitting: Gastroenterology

## 2018-01-13 ENCOUNTER — Ambulatory Visit: Payer: BLUE CROSS/BLUE SHIELD | Admitting: Gastroenterology

## 2018-01-20 ENCOUNTER — Telehealth: Payer: Self-pay

## 2018-01-20 MED ORDER — DEXLANSOPRAZOLE 60 MG PO CPDR: 60.0000 mg | DELAYED_RELEASE_CAPSULE | Freq: Every day | ORAL | 0 refills | Status: DC

## 2018-01-20 NOTE — Telephone Encounter (Signed)
Sent refill to patients pharmacy. 

## 2018-01-27 ENCOUNTER — Ambulatory Visit (INDEPENDENT_AMBULATORY_CARE_PROVIDER_SITE_OTHER): Payer: Self-pay | Admitting: Gastroenterology

## 2018-01-27 ENCOUNTER — Encounter: Payer: Self-pay | Admitting: Gastroenterology

## 2018-01-27 VITALS — BP 160/100 | HR 86 | Ht 71.0 in | Wt 215.1 lb

## 2018-01-27 DIAGNOSIS — K22719 Barrett's esophagus with dysplasia, unspecified: Secondary | ICD-10-CM

## 2018-01-27 DIAGNOSIS — K219 Gastro-esophageal reflux disease without esophagitis: Secondary | ICD-10-CM

## 2018-01-27 MED ORDER — DEXLANSOPRAZOLE 60 MG PO CPDR: 60.0000 mg | DELAYED_RELEASE_CAPSULE | Freq: Every day | ORAL | 11 refills | Status: DC

## 2018-01-27 NOTE — Patient Instructions (Addendum)
If you are age 58 or older, your body mass index should be between 23-30. Your Body mass index is 30 kg/m. If this is out of the aforementioned range listed, please consider follow up with your Primary Care Provider.  If you are age 43 or younger, your body mass index should be between 19-25. Your Body mass index is 30 kg/m. If this is out of the aformentioned range listed, please consider follow up with your Primary Care Provider.   We have sent the following medications to your pharmacy for you to pick up at your convenience: Dexilant 60 mg once daily.   You will be due for a recall colonoscopy in 07/2020. We will send you a reminder in the mail when it gets closer to that time.  You will be due for a recall endoscopy in 11/2019. We will send you a reminder in the mail when it gets closer to that time.   Thank you,  Dr. Lynann Bologna

## 2018-01-27 NOTE — Progress Notes (Signed)
IMPRESSION and PLAN:    #1.GERD with history of Barrett's esophagus (last EGD 11/2016- neg Bx) -Continue Dexilant 60 mg p.o. once a day.  Samples and coupons were given. -Nonpharmacologic means of reflux control. -Next EGD 11/2019. -Follow-up in 1 year, earlier in case of any problems.      HPI:    Chief Complaint:   Rick Livingston is a 57 y.o. male  Follow-up Here for medication refill No nausea, vomiting, heartburn, regurgitation, odynophagia or dysphagia.  No significant diarrhea or constipation.  There is no melena or hematochezia. No unintentional weight loss. Has been paying over $100 for Dexilant every month. I have explained long-term side effects of PPIs. He starts having breakthrough symptoms if he misses even a single dose and is not ready to come off.  Past GI procedures: -EGD as above -Colonoscopy 07/2015 mild sigmoid diverticulosis, highly redundant colon difficult to examine.  Recommended repeating colonoscopy in 5 years due to the anatomy.   Past Medical History:  Diagnosis Date  . ADD (attention deficit disorder with hyperactivity)   . Barrett's esophagus   . Constipation   . GERD (gastroesophageal reflux disease)   . Head injury   . Hepatitis C virus   . Hyperlipemia     Current Outpatient Medications  Medication Sig Dispense Refill  . aspirin 81 MG chewable tablet Chew 81 mg by mouth daily.    . cephALEXin (KEFLEX) 500 MG capsule Take 1 capsule (500 mg total) by mouth 4 (four) times daily. 20 capsule 0  . dexlansoprazole (DEXILANT) 60 MG capsule Take 1 capsule (60 mg total) by mouth daily. 30 capsule 0  . Multiple Vitamin (MULITIVITAMIN WITH MINERALS) TABS Take 1 tablet by mouth daily.    Marland Kitchen oxyCODONE-acetaminophen (PERCOCET/ROXICET) 5-325 MG per tablet Take 1 tablet by mouth every 4 (four) hours as needed for pain. 15 tablet 0  . ranitidine (ZANTAC) 150 MG tablet Take 150 mg by mouth 2 (two) times daily as needed. For acid reflux     No current  facility-administered medications for this visit.     Past Surgical History:  Procedure Laterality Date  . ACHILLES TENDON REPAIR    . COLONOSCOPY  07/10/2015   Mild sigmoid diverticulosis.   Marland Kitchen ESOPHAGOGASTRODUODENOSCOPY  11/23/2016   Hiatal hernia. Barretts esophagus.     Family History  Problem Relation Age of Onset  . Leukemia Father   . Sudden death Neg Hx   . Hypertension Neg Hx   . Heart attack Neg Hx   . Diabetes Neg Hx   . Hyperlipidemia Neg Hx   . Colon cancer Neg Hx   . Esophageal cancer Neg Hx     Social History   Tobacco Use  . Smoking status: Never Smoker  . Smokeless tobacco: Never Used  Substance Use Topics  . Alcohol use: No  . Drug use: No    Allergies  Allergen Reactions  . Other     "cough med that starts with a c"     Review of Systems: All systems reviewed and negative except where noted in HPI.    Physical Exam:     BP (!) 160/100   Pulse 86   Ht 5\' 11"  (1.803 m)   Wt 215 lb 2 oz (97.6 kg)   BMI 30.00 kg/m  @WEIGHTLAST3 @ GENERAL:  Alert, oriented, cooperative, not in acute distress. PSYCH: :Pleasant, normal mood and affect. HEENT:  conjunctiva pink, mucous membranes moist, neck supple without masses. No  jaundice. CARDIAC:  S1 S2 normal. No murmers. PULM: Normal respiratory effort, lungs CTA bilaterally, no wheezing. ABDOMEN: Inspection: No visible peristalsis, no abnormal pulsations, skin normal.  Palpation/percussion: Soft, nontender, nondistended, no rigidity, no abnormal dullness to percussion, no hepatosplenomegaly and no palpable abdominal masses.  Auscultation: Normal bowel sounds, no abdominal bruits. Rectal exam: Deferred SKIN:  turgor, no lesions seen. Musculoskeletal:  Normal muscle tone, normal strength. NEURO: Alert and oriented x 3, no focal neurologic deficits. I spent 15 minutes of face-to-face time with the patient. Greater than 50% of the time was spent counseling and coordinating care.    Granger Chui,MD  01/27/2018, 9:28 AM   CC Everlean Cherry, MD

## 2019-02-22 ENCOUNTER — Other Ambulatory Visit: Payer: Self-pay | Admitting: Gastroenterology

## 2019-10-11 ENCOUNTER — Encounter (HOSPITAL_BASED_OUTPATIENT_CLINIC_OR_DEPARTMENT_OTHER): Payer: Self-pay

## 2019-10-11 ENCOUNTER — Other Ambulatory Visit: Payer: Self-pay

## 2019-10-11 DIAGNOSIS — Z79899 Other long term (current) drug therapy: Secondary | ICD-10-CM | POA: Diagnosis not present

## 2019-10-11 DIAGNOSIS — B86 Scabies: Secondary | ICD-10-CM | POA: Diagnosis not present

## 2019-10-11 DIAGNOSIS — R21 Rash and other nonspecific skin eruption: Secondary | ICD-10-CM | POA: Diagnosis present

## 2019-10-11 NOTE — ED Triage Notes (Signed)
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

## 2019-10-12 ENCOUNTER — Encounter (HOSPITAL_BASED_OUTPATIENT_CLINIC_OR_DEPARTMENT_OTHER): Payer: Self-pay | Admitting: Emergency Medicine

## 2019-10-12 ENCOUNTER — Emergency Department (HOSPITAL_BASED_OUTPATIENT_CLINIC_OR_DEPARTMENT_OTHER)
Admission: EM | Admit: 2019-10-12 | Discharge: 2019-10-12 | Disposition: A | Discharge status: Home / Self Care | Payer: BLUE CROSS/BLUE SHIELD | Attending: Emergency Medicine | Admitting: Emergency Medicine

## 2019-10-12 DIAGNOSIS — B86 Scabies: Secondary | ICD-10-CM

## 2019-10-12 MED ORDER — PERMETHRIN 5 % EX CREA: TOPICAL_CREAM | CUTANEOUS | 0 refills | Status: DC

## 2019-10-12 NOTE — ED Provider Notes (Signed)
[place] EMERGENCY DEPARTMENT Provider Note   CSN: [id number] Arrival date & time: 10/11/19  2311     History Chief Complaint  Patient presents with   Insect Bite    Rick Livingston is a 59 y.o. male.  The history is provided by the patient.  Animal Bite Contact animal:  Insect Animal bite location: arms etc  Pain details:    Quality:  Aching and itching   Severity:  Moderate   Timing:  Constant   Progression:  Unchanged Incident location:  Home Provoked: unprovoked   Notifications:  None Relieved by:  Nothing Worsened by:  Nothing Ineffective treatments:  None tried Associated symptoms: no fever and no rash   Has seen multiple physicians about this.  Produced mites he pulled from the skin.       Past Medical History:  Diagnosis Date   ADD (attention deficit disorder with hyperactivity)    Barrett's esophagus    Constipation    GERD (gastroesophageal reflux disease)    Head injury    Hepatitis C virus    Hyperlipemia     Patient Active Problem List   Diagnosis Date Noted   Neck injury 09/01/2012   TIA (transient ischemic attack) 06/15/2011    Past Surgical History:  Procedure Laterality Date   ACHILLES TENDON REPAIR     COLONOSCOPY  07/10/2015   Mild sigmoid diverticulosis.    ESOPHAGOGASTRODUODENOSCOPY  11/23/2016   Hiatal hernia. Barretts esophagus.        Family History  Problem Relation Age of Onset   Leukemia Father    Sudden death Neg Hx    Hypertension Neg Hx    Heart attack Neg Hx    Diabetes Neg Hx    Hyperlipidemia Neg Hx    Colon cancer Neg Hx    Esophageal cancer Neg Hx     Social History   Tobacco Use   Smoking status: Never Smoker   Smokeless tobacco: Never Used  Vaping Use   Vaping Use: Never used  Substance Use Topics   Alcohol use: Yes    Comment: occ   Drug use: No    Home Medications Prior to Admission medications   Medication Sig Start Date End Date Taking? Authorizing  Provider  aspirin 81 MG chewable tablet Chew 81 mg by mouth daily.    [provider]  cephALEXin (KEFLEX) 500 MG capsule Take 1 capsule (500 mg total) by mouth 4 (four) times daily. 05/24/15   Dowless, Aldona Bar Tripp, PA-C  dexlansoprazole (DEXILANT) 60 MG capsule Take 1 capsule (60 mg total) by mouth daily. 01/27/18   Jackquline Denmark, MD  Multiple Vitamin (MULITIVITAMIN WITH MINERALS) TABS Take 1 tablet by mouth daily.    [provider]  oxyCODONE-acetaminophen (PERCOCET/ROXICET) 5-325 MG per tablet Take 1 tablet by mouth every 4 (four) hours as needed for pain. 08/30/12   Ripley Fraise, MD  permethrin (ELIMITE) 5 % cream Apply to affected area once leave on for 8 hours and wash off 10/12/19   Samin Milke, MD  ranitidine (ZANTAC) 150 MG tablet Take 150 mg by mouth 2 (two) times daily as needed. For acid reflux    [provider]    Allergies    Other  Review of Systems   Review of Systems  Constitutional: Negative for fever.  HENT: Negative for congestion.   Eyes: Negative for visual disturbance.  Respiratory: Negative for shortness of breath.   Gastrointestinal: Negative for abdominal distention.  Genitourinary: Negative for  difficulty urinating.  Musculoskeletal: Negative for arthralgias.  Skin: Negative for rash.  Neurological: Negative for dizziness.  Psychiatric/Behavioral: Negative for agitation.  All other systems reviewed and are negative.   Physical Exam Updated Vital Signs There were no vitals taken for this visit.  Physical Exam Vitals and nursing note reviewed.  Constitutional:      Appearance: Normal appearance.  HENT:     Head: Normocephalic and atraumatic.     Nose: Nose normal.  Eyes:     Conjunctiva/sclera: Conjunctivae normal.     Pupils: Pupils are equal, round, and reactive to light.  Cardiovascular:     Rate and Rhythm: Normal rate and regular rhythm.     Pulses: Normal pulses.     Heart sounds: Normal heart sounds.    Pulmonary:     Effort: Pulmonary effort is normal.     Breath sounds: Normal breath sounds.  Abdominal:     General: Abdomen is flat. Bowel sounds are normal.     Tenderness: There is no abdominal tenderness. There is no guarding.  Musculoskeletal:        General: Normal range of motion.     Cervical back: Normal range of motion and neck supple.  Skin:    General: Skin is warm and dry.     Capillary Refill: Capillary refill takes less than 2 seconds.     Comments: Burrows and scabs   Neurological:     General: No focal deficit present.     Mental Status: He is alert and oriented to person, place, and time.  Psychiatric:        Thought Content: Thought content normal.     ED Results / Procedures / Treatments   Labs (all labs ordered are listed, but only abnormal results are displayed) Labs Reviewed - No data to display  EKG None  Radiology No results found.  Procedures Procedures (including critical care time)  Medications Ordered in ED Medications - No data to display  ED Course  I have reviewed the triage vital signs and the nursing notes.  Pertinent labs & imaging results that were available during my care of the patient were reviewed by me and considered in my medical decision making (see chart for details).    Patient produced mite that is consistent with scabies mite. Exam is consistent with scabies.   Patient wants antibiotics.  I have explained this will not help and have prescribed the appropriated treatment for scabies.   Rick Livingston was evaluated in Emergency Department on 10/12/2019 for the symptoms described in the history of present illness. He was evaluated in the context of the global COVID-19 pandemic, which necessitated consideration that the patient might be at risk for infection with the SARS-CoV-2 virus that causes COVID-19. Institutional protocols and algorithms that pertain to the evaluation of patients at risk for COVID-19 are in a state of  rapid change based on information released by regulatory bodies including the CDC and federal and state organizations. These policies and algorithms were followed during the patient's care in the ED.   Final Clinical Impression(s) / ED Diagnoses Final diagnoses:  Scabies   Return for intractable cough, coughing up blood,fevers >100.4 unrelieved by medication, shortness of breath, intractable vomiting, chest pain, shortness of breath, weakness,numbness, changes in speech, facial asymmetry,abdominal pain, passing out,Inability to tolerate liquids or food, cough, altered mental status or any concerns. No signs of systemic illness or infection. The patient is nontoxic-appearing on exam and vital signs are within  normal limits.   I have reviewed the triage vital signs and the nursing notes. Pertinent labs &imaging results that were available during my care of the patient were reviewed by me and considered in my medical decision making (see chart for details).After history, exam, and medical workup I feel the patient has beenappropriately medically screened and is safe for discharge home. Pertinent diagnoses were discussed with the patient. Patient was given return precautions.    Rx / DC Orders ED Discharge Orders         Ordered    permethrin (ELIMITE) 5 % cream     Discontinue  Reprint     10/12/19 0353           Aslee Such, MD 10/12/19 [id number]

## 2020-04-17 ENCOUNTER — Emergency Department (HOSPITAL_COMMUNITY)
Admission: EM | Admit: 2020-04-17 | Discharge: 2020-04-18 | Disposition: A | Discharge status: Home / Self Care | Payer: BLUE CROSS/BLUE SHIELD | Attending: Emergency Medicine | Admitting: Emergency Medicine

## 2020-04-17 DIAGNOSIS — L03115 Cellulitis of right lower limb: Secondary | ICD-10-CM | POA: Diagnosis not present

## 2020-04-17 DIAGNOSIS — L03113 Cellulitis of right upper limb: Secondary | ICD-10-CM | POA: Insufficient documentation

## 2020-04-17 DIAGNOSIS — Z7982 Long term (current) use of aspirin: Secondary | ICD-10-CM | POA: Diagnosis not present

## 2020-04-17 DIAGNOSIS — D72829 Elevated white blood cell count, unspecified: Secondary | ICD-10-CM | POA: Diagnosis not present

## 2020-04-17 DIAGNOSIS — Z8673 Personal history of transient ischemic attack (TIA), and cerebral infarction without residual deficits: Secondary | ICD-10-CM | POA: Diagnosis not present

## 2020-04-17 DIAGNOSIS — L0390 Cellulitis, unspecified: Secondary | ICD-10-CM

## 2020-04-17 DIAGNOSIS — L02415 Cutaneous abscess of right lower limb: Secondary | ICD-10-CM | POA: Diagnosis present

## 2020-04-17 NOTE — ED Notes (Signed)
Pt called x 3  No answer. 

## 2020-04-18 ENCOUNTER — Emergency Department (HOSPITAL_COMMUNITY): Payer: BLUE CROSS/BLUE SHIELD

## 2020-04-18 ENCOUNTER — Other Ambulatory Visit: Payer: Self-pay

## 2020-04-18 ENCOUNTER — Encounter (HOSPITAL_COMMUNITY): Payer: Self-pay | Admitting: *Deleted

## 2020-04-18 LAB — COMPREHENSIVE METABOLIC PANEL
ALT: 25 U/L (ref 0–44)
AST: 23 U/L (ref 15–41)
Albumin: 3.6 g/dL (ref 3.5–5.0)
Alkaline Phosphatase: 68 U/L (ref 38–126)
Anion gap: 11 (ref 5–15)
BUN: 15 mg/dL (ref 6–20)
CO2: 25 mmol/L (ref 22–32)
Calcium: 9.1 mg/dL (ref 8.9–10.3)
Chloride: 99 mmol/L (ref 98–111)
Creatinine, Ser: 0.92 mg/dL (ref 0.61–1.24)
GFR, Estimated: >60 mL/min (ref >60)
Glucose, Bld: 104 mg/dL — ABNORMAL HIGH (ref 70–99)
Potassium: 3.8 mmol/L (ref 3.5–5.1)
Sodium: 135 mmol/L (ref 135–145)
Total Bilirubin: 0.5 mg/dL (ref 0.3–1.2)
Total Protein: 7.3 g/dL (ref 6.5–8.1)

## 2020-04-18 LAB — URINALYSIS, ROUTINE W REFLEX MICROSCOPIC
Bilirubin Urine: NEGATIVE
Glucose, UA: NEGATIVE mg/dL
Hgb urine dipstick: NEGATIVE
Ketones, ur: NEGATIVE mg/dL
Leukocytes,Ua: NEGATIVE
Nitrite: NEGATIVE
Protein, ur: NEGATIVE mg/dL
Specific Gravity, Urine: 1.005 (ref 1.005–1.030)
pH: 6 (ref 5.0–8.0)

## 2020-04-18 LAB — CBC WITH DIFFERENTIAL/PLATELET
Abs Immature Granulocytes: 0.06 10*3/uL (ref 0.00–0.07)
Basophils Absolute: 0 10*3/uL (ref 0.0–0.1)
Basophils Relative: 0 %
Eosinophils Absolute: 0.2 10*3/uL (ref 0.0–0.5)
Eosinophils Relative: 2 %
HCT: 41.7 % (ref 39.0–52.0)
Hemoglobin: 14.5 g/dL (ref 13.0–17.0)
Immature Granulocytes: 1 %
Lymphocytes Relative: 11 %
Lymphs Abs: 1.5 10*3/uL (ref 0.7–4.0)
MCH: 33.1 pg (ref 26.0–34.0)
MCHC: 34.8 g/dL (ref 30.0–36.0)
MCV: 95.2 fL (ref 80.0–100.0)
Monocytes Absolute: 1 10*3/uL (ref 0.1–1.0)
Monocytes Relative: 8 %
Neutro Abs: 10.4 10*3/uL — ABNORMAL HIGH (ref 1.7–7.7)
Neutrophils Relative %: 78 %
Platelets: 335 10*3/uL (ref 150–400)
RBC: 4.38 MIL/uL (ref 4.22–5.81)
RDW: 11.8 % (ref 11.5–15.5)
WBC: 13.2 10*3/uL — ABNORMAL HIGH (ref 4.0–10.5)
nRBC: 0 % (ref 0.0–0.2)

## 2020-04-18 LAB — PROTIME-INR
INR: 1 (ref 0.8–1.2)
Prothrombin Time: 12.7 seconds (ref 11.4–15.2)

## 2020-04-18 LAB — LACTIC ACID, PLASMA: Lactic Acid, Venous: 1.4 mmol/L (ref 0.5–1.9)

## 2020-04-18 MED ORDER — DEXTROSE 5 % IV SOLN
1500.0000 mg | Freq: Once | INTRAVENOUS | Status: AC
  Administered 2020-04-18: 1500 mg via INTRAVENOUS
  Filled 2020-04-18: qty 75

## 2020-04-18 NOTE — ED Triage Notes (Signed)
The pt has numerus bites or insect lesions scattered over his body he has abscesses scattered over his body for 2 months

## 2020-04-18 NOTE — ED Provider Notes (Signed)
[facility] EMERGENCY DEPARTMENT Provider Note   CSN: [id number] Arrival date & time: 04/17/20  1940     History Chief Complaint  Patient presents with  . abscesses    Rick Livingston is a 59 y.o. male.  HPI   59 year old male with history of ADD, Barrett's esophagus, constipation, GERD, head injury, hep C, hyperlipidemia, who presents emergency department today for evaluation of multiple abscesses.  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  States that he had an area on his thigh that he tried to open up using a knife at home.  Since then he has had increased swelling, redness and pain to the right thigh.  He was seen at urgent care and was told to come to the emergency department for IV antibiotics.  He denies any systemic symptoms at home.  He does admit to cocaine use.  He denies any IV recent IV drug use but states that he has done this back in the 80s.  Past Medical History:  Diagnosis Date  . ADD (attention deficit disorder with hyperactivity)   . Barrett's esophagus   . Constipation   . GERD (gastroesophageal reflux disease)   . Head injury   . Hepatitis C virus   . Hyperlipemia     Patient Active Problem List   Diagnosis Date Noted  . Neck injury 09/01/2012  . TIA (transient ischemic attack) 06/15/2011    Past Surgical History:  Procedure Laterality Date  . ACHILLES TENDON REPAIR    . COLONOSCOPY  07/10/2015   Mild sigmoid diverticulosis.   Marland Kitchen ESOPHAGOGASTRODUODENOSCOPY  11/23/2016   Hiatal hernia. Barretts esophagus.        Family History  Problem Relation Age of Onset  . Leukemia Father   . Sudden death Neg Hx   . Hypertension Neg Hx   . Heart attack Neg Hx   . Diabetes Neg Hx   . Hyperlipidemia Neg Hx   . Colon cancer Neg Hx   . Esophageal cancer Neg Hx     Social History   Tobacco Use  . Smoking status: Never Smoker  . Smokeless tobacco: Never Used  Vaping Use  . Vaping Use: Never  used  Substance Use Topics  . Alcohol use: Yes    Comment: occ  . Drug use: No    Home Medications Prior to Admission medications   Medication Sig Start Date End Date Taking? Authorizing Provider  aspirin 81 MG chewable tablet Chew 81 mg by mouth daily.    [provider]  cephALEXin (KEFLEX) 500 MG capsule Take 1 capsule (500 mg total) by mouth 4 (four) times daily. 05/24/15   Dowless, Lelon Mast Tripp, PA-C  dexlansoprazole (DEXILANT) 60 MG capsule Take 1 capsule (60 mg total) by mouth daily. 01/27/18   Lynann Bologna, MD  Multiple Vitamin (MULITIVITAMIN WITH MINERALS) TABS Take 1 tablet by mouth daily.    [provider]  oxyCODONE-acetaminophen (PERCOCET/ROXICET) 5-325 MG per tablet Take 1 tablet by mouth every 4 (four) hours as needed for pain. 08/30/12   Zadie Rhine, MD  permethrin (ELIMITE) 5 % cream Apply to affected area once leave on for 8 hours and wash off 10/12/19   Palumbo, April, MD  ranitidine (ZANTAC) 150 MG tablet Take 150 mg by mouth 2 (two) times daily as needed. For acid reflux    [provider]    Allergies    Other  Review of Systems   Review of  Systems  Constitutional: Negative for fever.  HENT: Negative for ear pain and sore throat.   Eyes: Negative for visual disturbance.  Respiratory: Negative for cough and shortness of breath.   Cardiovascular: Negative for chest pain.  Gastrointestinal: Negative for abdominal pain, constipation, diarrhea, nausea and vomiting.  Genitourinary: Negative for dysuria and hematuria.  Musculoskeletal: Negative for back pain.  Skin: Positive for color change, rash and wound.  Neurological: Negative for headaches.  All other systems reviewed and are negative.   Physical Exam Updated Vital Signs BP 120/71 (BP Location: Right Arm)   Pulse 71   Temp 98.3 F (36.8 C) (Oral)   Resp 18   Ht 5\' 11"  (1.803 m)   Wt 89.8 kg   SpO2 99%   BMI 27.61 kg/m   Physical Exam Vitals and nursing note  reviewed.  Constitutional:      Appearance: He is well-developed and well-nourished.  HENT:     Head: Normocephalic and atraumatic.  Eyes:     Conjunctiva/sclera: Conjunctivae normal.  Cardiovascular:     Rate and Rhythm: Normal rate and regular rhythm.     Heart sounds: Normal heart sounds. No murmur heard.   Pulmonary:     Effort: Pulmonary effort is normal. No respiratory distress.     Breath sounds: Normal breath sounds.  Abdominal:     General: Bowel sounds are normal.     Palpations: Abdomen is soft.     Tenderness: There is no abdominal tenderness.  Musculoskeletal:        General: No edema.     Cervical back: Neck supple.  Skin:    General: Skin is warm and dry.     Comments: Contact dermatitis noted to the ble. Scabbed area with 4x4cm area of cellulitis to the right forearm. Larger area of cellulitis to the RLE pictured below. BLE edema noted.   Neurological:     Mental Status: He is alert.  Psychiatric:        Mood and Affect: Mood and affect normal.             ED Results / Procedures / Treatments   Labs (all labs ordered are listed, but only abnormal results are displayed) Labs Reviewed  COMPREHENSIVE METABOLIC PANEL - Abnormal; Notable for the following components:      Result Value   Glucose, Bld 104 (*)    All other components within normal limits  CBC WITH DIFFERENTIAL/PLATELET - Abnormal; Notable for the following components:   WBC 13.2 (*)    Neutro Abs 10.4 (*)    All other components within normal limits  URINALYSIS, ROUTINE W REFLEX MICROSCOPIC - Abnormal; Notable for the following components:   Color, Urine STRAW (*)    All other components within normal limits  CULTURE, BLOOD (ROUTINE X 2)  CULTURE, BLOOD (ROUTINE X 2)  LACTIC ACID, PLASMA  PROTIME-INR  LACTIC ACID, PLASMA    EKG None  Radiology DG Chest 2 View  Result Date: 04/18/2020 CLINICAL DATA:  Sepsis EXAM: CHEST - 2 VIEW COMPARISON:  07/24/2016 FINDINGS: Lungs are well  expanded, symmetric, and clear. No pneumothorax or pleural effusion. Cardiac size within normal limits. Pulmonary vascularity is normal. Osseous structures are age-appropriate. No acute bone abnormality. IMPRESSION: No active cardiopulmonary disease. Electronically Signed   By: 07/26/2016 MD   On: 04/18/2020 00:56    Procedures Procedures (including critical care time)  Medications Ordered in ED Medications  dalbavancin (DALVANCE) 1,500 mg in dextrose 5 % 500 mL  IVPB (has no administration in time range)    ED Course  I have reviewed the triage vital signs and the nursing notes.  Pertinent labs & imaging results that were available during my care of the patient were reviewed by me and considered in my medical decision making (see chart for details).    MDM Rules/Calculators/A&P                          59 year old male presenting for evaluation due to concern for skin infection.  Reviewed/interpreted labs CBC with a leukocytosis of 13,000 CMP is unremarkable Lactic acid is negative Coags negative UA negative  Chest x-ray unremarkable.  On exam patient has evidence of cellulitis to the right lower extremity.  He is febrile.  He is not tachycardic or hypotensive.  Lactic acid is normal.  Low suspicion for sepsis. Dr Eudelia Bunch evaluated the pt and discussed admission versus d/c with dalbavancin. He prefers d/c with dalbavancin, this was given in the ed after discussion with pharmacy. He will f/u with ID and return if worse. Pt voices understanding of the plan and reasons to return. All questions answered, pt stable for d/c.   Final Clinical Impression(s) / ED Diagnoses Final diagnoses:  Cellulitis, unspecified cellulitis site    Rx / DC Orders ED Discharge Orders         Ordered    Ambulatory referral to Infectious Disease       Comments: Cellulitis patient:  Received dalbavancin on 04/18/2020.   04/18/20 0631           Lakynn Halvorsen S, PA-C 04/18/20 1700     Nira Conn, MD 04/18/20 [id number]

## 2020-04-18 NOTE — ED Notes (Signed)
Pt d/c home per MD order. Discharge summary reviewed with pt, pt verbalizes understanding. Ambulatory off unit. No s/s of acute distress noted.  

## 2020-04-18 NOTE — Discharge Instructions (Addendum)
You were given a prescription for antibiotics. Please take the antibiotic prescription fully.   Please follow up with your primary care provider or with the infectious disease clinic within 2 days for re-evaluation of your symptoms.   Please return to the emergency room immediately if you experience any new or worsening symptoms or any symptoms that indicate worsening infection such as fevers, increased redness/swelling/pain, warmth, or drainage from the affected area.

## 2020-04-18 NOTE — Progress Notes (Signed)
Pharmacy Note:   Dalbavancin for Acute Bacterial Skin and Skin Structure Infection (ABSSSI) Patients to [facility] Discharge  59 y.o. male with who presented to [facility] on 12/16 with an Acute Bacterial Skin and Skin Structure Infection  Inclusion criteria - Indication []  Moderately large skin lesion (>=75 cm2 or larger - about the size of a baseball) [x]  Cellulitis  Inclusion Criteria - at least one SIRS criteria present [x]  WBC > 12,000 or < 4000 []  temp >100.9 or < 96.8 []  heart rate >90[]  respiratory rate >20  Patient was evaluated for the following exclusion criteria and no exclusions were found  Hardware involvement Hypotension / shock Elevated lactate (>2) without other explanation Gram-negative infection risk factors (bites, water exposure, infection after trauma, infection after skin graft, neutropenia, burns, severe immunocompromise) Necrotizing fasciitis possible or confirmed Known or suspected osteomyelitis or septic arthritis Endocarditis Diabetic foot infection Ischemic ulcers  Post-operative wound infection Perirectal infections Need for drainage in the operating room Hand or facial infections  Injection drug users with a fever Bacteremia Allergy to related antibiotics like vancomycin, Known liver disease (t.bili >2x ULN or AST/ALT 3x ULN)  , PharmD, BCPS 04/18/2020 6:44 AM  Clinical Pharmacist

## 2020-04-23 LAB — CULTURE, BLOOD (ROUTINE X 2)
Culture: NO GROWTH
Culture: NO GROWTH
Special Requests: ADEQUATE

## 2020-05-10 ENCOUNTER — Telehealth: Payer: Self-pay

## 2020-05-10 ENCOUNTER — Ambulatory Visit: Payer: BLUE CROSS/BLUE SHIELD | Admitting: Internal Medicine

## 2020-05-10 NOTE — Telephone Encounter (Signed)
Patient notified of cancelled appointment; patient inquired about if he needed to be started on abx. He has not been seen in clinic; no abx orders currently in chart. Forwarding to provider for direction.  Hitoshi Werts Loyola Mast, RN

## 2020-05-10 NOTE — Telephone Encounter (Signed)
Called patient to let him know that we would wait to start abx (if needed) until next week. Patient expressed concern about waiting as his was told he could lose his leg however informed patient that he did receive a dose while he was in the ED and if he needs additional doses we'll arrange after his visit. Patient verbalized understanding.   Xela Oregel Loyola Mast, RN

## 2020-05-10 NOTE — Telephone Encounter (Signed)
I would wait until after seeing him.  The dose of dalbavancin in the ED should have been adequate treatment course for cellulitis.

## 2020-05-17 ENCOUNTER — Ambulatory Visit (INDEPENDENT_AMBULATORY_CARE_PROVIDER_SITE_OTHER): Payer: BLUE CROSS/BLUE SHIELD | Admitting: Internal Medicine

## 2020-05-17 ENCOUNTER — Encounter: Payer: Self-pay | Admitting: Internal Medicine

## 2020-05-17 ENCOUNTER — Other Ambulatory Visit: Payer: Self-pay

## 2020-05-17 VITALS — BP 124/74 | HR 84 | Temp 97.4°F | Ht 71.0 in | Wt 212.0 lb

## 2020-05-17 DIAGNOSIS — L03116 Cellulitis of left lower limb: Secondary | ICD-10-CM

## 2020-05-17 DIAGNOSIS — R600 Localized edema: Secondary | ICD-10-CM | POA: Diagnosis not present

## 2020-05-17 NOTE — Patient Instructions (Signed)
It was a pleasure meeting you today.  Please follow up with your primary doctor for continued evaluation of your lower extremity swelling. I do not see any evidence of ongoing infection right now.

## 2020-05-17 NOTE — Progress Notes (Signed)
[facility]  Reason for Consult: follow up cellulitis  Referring Provider: ED   HPI:    Rick Livingston is a 60 y.o. male who presents to clinic for follow up cellulitis.     Patient was seen in the ED on 04/18/20 for cellulitis.  Labs revealed WBC 13.2.  He was given a dose of dalbavancin 1500mg  and discharged for ID follow up from there.  He presents today for follow up.  Patient's Medications  New Prescriptions   No medications on file  Previous Medications   ASPIRIN 81 MG CHEWABLE TABLET    Chew 81 mg by mouth daily.   DEXLANSOPRAZOLE (DEXILANT) 60 MG CAPSULE    Take 1 capsule (60 mg total) by mouth daily.   DOCUSATE SODIUM (DSS) 100 MG CAPS    Take by mouth.   MULTIPLE VITAMIN (MULITIVITAMIN WITH MINERALS) TABS    Take 1 tablet by mouth daily.   OMEGA-3 FISH OIL (MAXEPA) 1000 MG CAPS CAPSULE    daily.   SILDENAFIL (VIAGRA) 100 MG TABLET    TAKE 1 TABLET DAILY 1 HOUR BEFORE NEEDED   TRAZODONE (DESYREL) 100 MG TABLET       VALACYCLOVIR (VALTREX) 1000 MG TABLET    TAKE 1 TABLET BY MOUTH EVERY DAY TO PREVENT OUTBREAKS  Modified Medications   No medications on file  Discontinued Medications   CEPHALEXIN (KEFLEX) 500 MG CAPSULE    Take 1 capsule (500 mg total) by mouth 4 (four) times daily.   OXYCODONE-ACETAMINOPHEN (PERCOCET/ROXICET) 5-325 MG PER TABLET    Take 1 tablet by mouth every 4 (four) hours as needed for pain.   PERMETHRIN (ELIMITE) 5 % CREAM    Apply to affected area once leave on for 8 hours and wash off   RANITIDINE (ZANTAC) 150 MG TABLET    Take 150 mg by mouth 2 (two) times daily as needed. For acid reflux      Past Medical History:  Diagnosis Date  . ADD (attention deficit disorder with hyperactivity)   . Barrett's esophagus   . Constipation   . GERD (gastroesophageal reflux disease)   . Head injury   . Hepatitis C virus   . Hyperlipemia     Social History   Tobacco Use  . Smoking status: Never Smoker  . Smokeless  tobacco: Never Used  Vaping Use  . Vaping Use: Never used  Substance Use Topics  . Alcohol use: Yes    Comment: occ  . Drug use: No    Family History  Problem Relation Age of Onset  . Leukemia Father   . Sudden death Neg Hx   . Hypertension Neg Hx   . Heart attack Neg Hx   . Diabetes Neg Hx   . Hyperlipidemia Neg Hx   . Colon cancer Neg Hx   . Esophageal cancer Neg Hx     Allergies  Allergen Reactions  . Chloramphenicol Nausea And Vomiting  . Other Nausea And Vomiting    "cough med that starts with a c"    Review of Systems  Constitutional: Negative for chills and fever.  Respiratory: Negative.   Cardiovascular: Positive for leg swelling.  Skin: Positive for itching.      OBJECTIVE:    Vitals:   05/17/20 1536  BP: 124/74  Pulse: 84  Temp: (!) 97.4 F (36.3 C)  TempSrc: Oral  SpO2: 94%  Weight: 212 lb (96.2 kg)  Height: 5\' 11"  (1.803 m)  Body mass index is 29.57 kg/m.  Physical Exam Constitutional:      General: He is not in acute distress.    Appearance: Normal appearance.  HENT:     Head: Normocephalic and atraumatic.  Pulmonary:     Effort: Pulmonary effort is normal. No respiratory distress.  Musculoskeletal:     Right lower leg: Edema present.     Left lower leg: Edema present.  Skin:    Comments: Multiple tattoos. Bilateral lower extremities with pink color and no significant warmth or erythema.  Scattered areas of hypopigmentation. Upper extremities with small areas of excoriation.   Neurological:     Mental Status: He is alert.      Labs and Microbiology:  CBC Latest Ref Rng & Units 04/18/2020 06/16/2011 06/15/2011  WBC 4.0 - 10.5 K/uL 13.2(H) 5.4 8.6  Hemoglobin 13.0 - 17.0 g/dL 58.3 09.4 07.6  Hematocrit 39.0 - 52.0 % 41.7 41.0 44.4  Platelets 150 - 400 K/uL 335 174 197   CMP Latest Ref Rng & Units 04/18/2020 06/16/2011 06/15/2011  Glucose 70 - 99 mg/dL 808(U) 91 110(R)  BUN 6 - 20 mg/dL 15 13 14   Creatinine 0.61 - 1.24  mg/dL 1.59 4.58  Sodium 135 - 145 mmol/L 135 140 138  Potassium 3.5 - 5.1 mmol/L 3.8 4.2 4.3  Chloride 98 - 111 mmol/L 99 104 102  CO2 22 - 32 mmol/L 25 29 28   Calcium 8.9 - 10.3 mg/dL 9.1 9.2 5.92  Total Protein 6.5 - 8.1 g/dL 7.3 7.4 )  Total Bilirubin 0.3 - 1.2 mg/dL 0.5 0.6 0.7  Alkaline Phos 38 - 126 U/L 68 63 75  AST 15 - 41 U/L 23 74(H) 82(H)  ALT 0 - 44 U/L 25 114(H) 121(H)     ASSESSMENT & PLAN:    1. Cellulitis of left lower extremity 2. Lower extremity edema  He received a dose of dalbavancin 1500mg  in the ED about 1 month ago and reports that the erythema that had been present on his left leg quickly receded.  He has had no further fevers or chills.  Erythema significantly improved but he continues to have pitting lower extremity edema.  I do not think further antibiotics are indicated at the moment given no signs of infection.  He is concerned regarding cause of lower extremity edema.  Discussed with him that he should follow up with his primary care regarding this and further evaluation to determine an etiology.  For now I have recommended compression stockings and elevating his legs when possible.    [facility] 05/17/2020, 3:55 PM

## 2020-11-23 ENCOUNTER — Encounter: Payer: Self-pay | Admitting: Gastroenterology

## 2021-03-09 ENCOUNTER — Other Ambulatory Visit: Payer: Self-pay

## 2021-03-09 ENCOUNTER — Emergency Department: Payer: BLUE CROSS/BLUE SHIELD

## 2021-03-09 ENCOUNTER — Encounter: Payer: Self-pay | Admitting: Emergency Medicine

## 2021-03-09 DIAGNOSIS — M25572 Pain in left ankle and joints of left foot: Secondary | ICD-10-CM | POA: Diagnosis not present

## 2021-03-09 DIAGNOSIS — M25562 Pain in left knee: Secondary | ICD-10-CM | POA: Insufficient documentation

## 2021-03-09 DIAGNOSIS — R0789 Other chest pain: Secondary | ICD-10-CM | POA: Insufficient documentation

## 2021-03-09 DIAGNOSIS — M546 Pain in thoracic spine: Secondary | ICD-10-CM | POA: Insufficient documentation

## 2021-03-09 DIAGNOSIS — M79671 Pain in right foot: Secondary | ICD-10-CM | POA: Diagnosis not present

## 2021-03-09 DIAGNOSIS — M25561 Pain in right knee: Secondary | ICD-10-CM | POA: Diagnosis not present

## 2021-03-09 DIAGNOSIS — M79622 Pain in left upper arm: Secondary | ICD-10-CM | POA: Insufficient documentation

## 2021-03-09 DIAGNOSIS — Z23 Encounter for immunization: Secondary | ICD-10-CM | POA: Diagnosis not present

## 2021-03-09 DIAGNOSIS — S0081XA Abrasion of other part of head, initial encounter: Secondary | ICD-10-CM | POA: Insufficient documentation

## 2021-03-09 DIAGNOSIS — S62310A Displaced fracture of base of second metacarpal bone, right hand, initial encounter for closed fracture: Secondary | ICD-10-CM | POA: Insufficient documentation

## 2021-03-09 DIAGNOSIS — R519 Headache, unspecified: Secondary | ICD-10-CM | POA: Diagnosis not present

## 2021-03-09 DIAGNOSIS — Z7982 Long term (current) use of aspirin: Secondary | ICD-10-CM | POA: Diagnosis not present

## 2021-03-09 DIAGNOSIS — S6991XA Unspecified injury of right wrist, hand and finger(s), initial encounter: Secondary | ICD-10-CM | POA: Diagnosis present

## 2021-03-09 DIAGNOSIS — R109 Unspecified abdominal pain: Secondary | ICD-10-CM | POA: Insufficient documentation

## 2021-03-09 DIAGNOSIS — M545 Low back pain, unspecified: Secondary | ICD-10-CM | POA: Diagnosis not present
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
## 2021-03-10 ENCOUNTER — Emergency Department: Payer: BLUE CROSS/BLUE SHIELD

## 2021-03-10 ENCOUNTER — Observation Stay (HOSPITAL_COMMUNITY)
Admission: AD | Admit: 2021-03-10 | Discharge: 2021-03-12 | Disposition: A | Discharge status: Home / Self Care | Payer: BLUE CROSS/BLUE SHIELD | Source: Other Acute Inpatient Hospital | Attending: Surgery | Admitting: Surgery

## 2021-03-10 ENCOUNTER — Encounter: Payer: Self-pay | Admitting: Radiology

## 2021-03-10 ENCOUNTER — Emergency Department
Admission: EM | Admit: 2021-03-10 | Discharge: 2021-03-10 | Disposition: A | Discharge status: Home / Self Care | Payer: BLUE CROSS/BLUE SHIELD | Attending: Emergency Medicine | Admitting: Emergency Medicine

## 2021-03-10 DIAGNOSIS — S62330A Displaced fracture of neck of second metacarpal bone, right hand, initial encounter for closed fracture: Principal | ICD-10-CM | POA: Insufficient documentation

## 2021-03-10 DIAGNOSIS — Z23 Encounter for immunization: Secondary | ICD-10-CM | POA: Insufficient documentation

## 2021-03-10 DIAGNOSIS — T1490XA Injury, unspecified, initial encounter: Secondary | ICD-10-CM

## 2021-03-10 DIAGNOSIS — S36039A Unspecified laceration of spleen, initial encounter: Secondary | ICD-10-CM | POA: Insufficient documentation

## 2021-03-10 DIAGNOSIS — S62310A Displaced fracture of base of second metacarpal bone, right hand, initial encounter for closed fracture: Secondary | ICD-10-CM

## 2021-03-10 DIAGNOSIS — S6491XA Injury of unspecified nerve at wrist and hand level of right arm, initial encounter: Secondary | ICD-10-CM | POA: Diagnosis present

## 2021-03-10 LAB — CBC WITH DIFFERENTIAL/PLATELET
Abs Immature Granulocytes: 0.06 10*3/uL (ref 0.00–0.07)
Basophils Absolute: 0 10*3/uL (ref 0.0–0.1)
Basophils Relative: 0 %
Eosinophils Absolute: 0 10*3/uL (ref 0.0–0.5)
Eosinophils Relative: 0 %
HCT: 33.7 % — ABNORMAL LOW (ref 39.0–52.0)
Hemoglobin: 11.4 g/dL — ABNORMAL LOW (ref 13.0–17.0)
Immature Granulocytes: 1 %
Lymphocytes Relative: 12 %
Lymphs Abs: 1.5 10*3/uL (ref 0.7–4.0)
MCH: 31.5 pg (ref 26.0–34.0)
MCHC: 33.8 g/dL (ref 30.0–36.0)
MCV: 93.1 fL (ref 80.0–100.0)
Monocytes Absolute: 1 10*3/uL (ref 0.1–1.0)
Monocytes Relative: 9 %
Neutro Abs: 9.2 10*3/uL — ABNORMAL HIGH (ref 1.7–7.7)
Neutrophils Relative %: 78 %
Platelets: 278 10*3/uL (ref 150–400)
RBC: 3.62 MIL/uL — ABNORMAL LOW (ref 4.22–5.81)
RDW: 13.1 % (ref 11.5–15.5)
WBC: 11.8 10*3/uL — ABNORMAL HIGH (ref 4.0–10.5)
nRBC: 0 % (ref 0.0–0.2)

## 2021-03-10 LAB — COMPREHENSIVE METABOLIC PANEL WITH GFR
ALT: 35 U/L (ref 0–44)
AST: 58 U/L — ABNORMAL HIGH (ref 15–41)
Albumin: 3.4 g/dL — ABNORMAL LOW (ref 3.5–5.0)
Alkaline Phosphatase: 51 U/L (ref 38–126)
Anion gap: 9 (ref 5–15)
BUN: 14 mg/dL (ref 8–23)
CO2: 24 mmol/L (ref 22–32)
Calcium: 8.3 mg/dL — ABNORMAL LOW (ref 8.9–10.3)
Chloride: 100 mmol/L (ref 98–111)
Creatinine, Ser: 0.73 mg/dL (ref 0.61–1.24)
GFR, Estimated: >60 mL/min
Glucose, Bld: 169 mg/dL — ABNORMAL HIGH (ref 70–99)
Potassium: 3.5 mmol/L (ref 3.5–5.1)
Sodium: 133 mmol/L — ABNORMAL LOW (ref 135–145)
Total Bilirubin: 1.1 mg/dL (ref 0.3–1.2)
Total Protein: 6.6 g/dL (ref 6.5–8.1)

## 2021-03-10 LAB — CBC
HCT: 32 % — ABNORMAL LOW (ref 39.0–52.0)
Hemoglobin: 10.9 g/dL — ABNORMAL LOW (ref 13.0–17.0)
MCH: 31.7 pg (ref 26.0–34.0)
MCHC: 34.1 g/dL (ref 30.0–36.0)
MCV: 93 fL (ref 80.0–100.0)
Platelets: 235 10*3/uL (ref 150–400)
RBC: 3.44 MIL/uL — ABNORMAL LOW (ref 4.22–5.81)
RDW: 13.2 % (ref 11.5–15.5)
WBC: 10.5 10*3/uL (ref 4.0–10.5)
nRBC: 0 % (ref 0.0–0.2)

## 2021-03-10 LAB — HIV ANTIBODY (ROUTINE TESTING W REFLEX): HIV Screen 4th Generation wRfx: NONREACTIVE

## 2021-03-10 MED ORDER — SODIUM CHLORIDE 0.9 % IV SOLN: 250.0000 mL | INTRAVENOUS | Status: DC | PRN

## 2021-03-10 MED ORDER — SODIUM CHLORIDE 0.9 % IV BOLUS (SEPSIS)
1000.0000 mL | Freq: Once | INTRAVENOUS | Status: AC
  Administered 2021-03-10: 1000 mL via INTRAVENOUS

## 2021-03-10 MED ORDER — DOCUSATE SODIUM 100 MG PO CAPS
100.0000 mg | ORAL_CAPSULE | Freq: Two times a day (BID) | ORAL | Status: DC
  Administered 2021-03-10: 100 mg via ORAL
  Filled 2021-03-10: qty 1

## 2021-03-10 MED ORDER — ENOXAPARIN SODIUM 30 MG/0.3ML IJ SOSY: 30.0000 mg | PREFILLED_SYRINGE | Freq: Two times a day (BID) | INTRAMUSCULAR | Status: DC

## 2021-03-10 MED ORDER — IOHEXOL 300 MG/ML  SOLN
100.0000 mL | Freq: Once | INTRAMUSCULAR | Status: AC | PRN
  Administered 2021-03-10: 100 mL via INTRAVENOUS

## 2021-03-10 MED ORDER — MORPHINE SULFATE (PF) 4 MG/ML IV SOLN
4.0000 mg | Freq: Once | INTRAVENOUS | Status: AC
  Administered 2021-03-10: 4 mg via INTRAVENOUS
  Filled 2021-03-10: qty 1

## 2021-03-10 MED ORDER — LACTATED RINGERS IV SOLN: INTRAVENOUS | Status: DC

## 2021-03-10 MED ORDER — BACITRACIN ZINC 500 UNIT/GM EX OINT
TOPICAL_OINTMENT | Freq: Once | CUTANEOUS | Status: AC
  Filled 2021-03-10: qty 0.9

## 2021-03-10 MED ORDER — ONDANSETRON HCL 4 MG/2ML IJ SOLN: 4.0000 mg | Freq: Four times a day (QID) | INTRAMUSCULAR | Status: DC | PRN

## 2021-03-10 MED ORDER — TETANUS-DIPHTH-ACELL PERTUSSIS 5-2.5-18.5 LF-MCG/0.5 IM SUSY
0.5000 mL | PREFILLED_SYRINGE | Freq: Once | INTRAMUSCULAR | Status: AC
  Administered 2021-03-10: 0.5 mL via INTRAMUSCULAR
  Filled 2021-03-10: qty 0.5

## 2021-03-10 MED ORDER — ONDANSETRON 4 MG PO TBDP: 4.0000 mg | ORAL_TABLET | Freq: Four times a day (QID) | ORAL | Status: DC | PRN

## 2021-03-10 MED ORDER — MORPHINE SULFATE (PF) 2 MG/ML IV SOLN: 2.0000 mg | INTRAVENOUS | Status: DC | PRN

## 2021-03-10 MED ORDER — MORPHINE SULFATE (PF) 4 MG/ML IV SOLN: 4.0000 mg | INTRAVENOUS | Status: DC | PRN

## 2021-03-10 MED ORDER — ONDANSETRON HCL 4 MG/2ML IJ SOLN
4.0000 mg | Freq: Once | INTRAMUSCULAR | Status: AC
  Administered 2021-03-10: 4 mg via INTRAVENOUS
  Filled 2021-03-10: qty 2

## 2021-03-10 MED ORDER — SODIUM CHLORIDE 0.9% FLUSH: 3.0000 mL | INTRAVENOUS | Status: DC | PRN

## 2021-03-10 MED ORDER — ACETAMINOPHEN 500 MG PO TABS
1000.0000 mg | ORAL_TABLET | Freq: Four times a day (QID) | ORAL | Status: DC
  Administered 2021-03-10: 1000 mg via ORAL
  Filled 2021-03-10: qty 2

## 2021-03-10 MED ORDER — OXYCODONE HCL 5 MG PO TABS: 5.0000 mg | ORAL_TABLET | ORAL | Status: DC | PRN

## 2021-03-10 MED ORDER — ACETAMINOPHEN 325 MG PO TABS: 650.0000 mg | ORAL_TABLET | ORAL | Status: DC | PRN

## 2021-03-10 MED ORDER — SODIUM CHLORIDE 0.9% FLUSH
3.0000 mL | Freq: Two times a day (BID) | INTRAVENOUS | Status: DC
  Administered 2021-03-11 (×3): 3 mL via INTRAVENOUS

## 2021-03-10 MED ORDER — OXYCODONE HCL 5 MG PO TABS
5.0000 mg | ORAL_TABLET | ORAL | Status: DC | PRN
  Administered 2021-03-10 – 2021-03-12 (×6): 5 mg via ORAL
  Filled 2021-03-10 (×6): qty 1

## 2021-03-10 NOTE — ED Notes (Signed)
Patient transported to X-ray 
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
## 2021-03-10 NOTE — ED Notes (Signed)
Called [facility] for transfer to [facility]  [phone]

## 2021-03-10 NOTE — ED Notes (Signed)
Sprite placed at bedside as requested by patient. Patient sleeping at this time. Respirations even and unlabored. NAD at this time.

## 2021-03-10 NOTE — ED Notes (Signed)
Right wrist splint placed by Faith, NT

## 2021-03-10 NOTE — ED Notes (Signed)
Patient given toothbrush and toothpaste. Warm rags given to clean face. Visitor at bedside.

## 2021-03-10 NOTE — ED Notes (Signed)
Called [facility] to check status, Rick Livingston stated patient waiting for bed assignment [id number]
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
## 2021-03-10 NOTE — ED Notes (Signed)
[facility] called patient assigned room  [address]   [facility] [address] to pick up  [phone]

## 2021-03-10 NOTE — ED Notes (Signed)
Lunch tray at bedside. Clear liquids opened for patient to eat. Patient talking on phone at this time.

## 2021-03-10 NOTE — ED Notes (Addendum)
PT at bedside.
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
## 2021-03-11 ENCOUNTER — Observation Stay (HOSPITAL_COMMUNITY): Payer: BLUE CROSS/BLUE SHIELD

## 2021-03-11 DIAGNOSIS — S62330A Displaced fracture of neck of second metacarpal bone, right hand, initial encounter for closed fracture: Secondary | ICD-10-CM | POA: Diagnosis not present

## 2021-03-11 LAB — CBC
HCT: 33.5 % — ABNORMAL LOW (ref 39.0–52.0)
Hemoglobin: 10.8 g/dL — ABNORMAL LOW (ref 13.0–17.0)
MCH: 30.4 pg (ref 26.0–34.0)
MCHC: 32.2 g/dL (ref 30.0–36.0)
MCV: 94.4 fL (ref 80.0–100.0)
Platelets: 247 10*3/uL (ref 150–400)
RBC: 3.55 MIL/uL — ABNORMAL LOW (ref 4.22–5.81)
RDW: 13.1 % (ref 11.5–15.5)
WBC: 8.9 10*3/uL (ref 4.0–10.5)
nRBC: 0 % (ref 0.0–0.2)

## 2021-03-11 LAB — BASIC METABOLIC PANEL
Anion gap: 7 (ref 5–15)
BUN: 10 mg/dL (ref 8–23)
CO2: 26 mmol/L (ref 22–32)
Calcium: 8 mg/dL — ABNORMAL LOW (ref 8.9–10.3)
Chloride: 103 mmol/L (ref 98–111)
Creatinine, Ser: 0.77 mg/dL (ref 0.61–1.24)
GFR, Estimated: >60 mL/min (ref >60)
Glucose, Bld: 111 mg/dL — ABNORMAL HIGH (ref 70–99)
Potassium: 3.7 mmol/L (ref 3.5–5.1)
Sodium: 136 mmol/L (ref 135–145)

## 2021-03-11 MED ORDER — METHOCARBAMOL 750 MG PO TABS
750.0000 mg | ORAL_TABLET | Freq: Three times a day (TID) | ORAL | Status: DC
  Administered 2021-03-11 – 2021-03-12 (×4): 750 mg via ORAL
  Filled 2021-03-11 (×4): qty 1

## 2021-03-11 MED ORDER — MORPHINE SULFATE (PF) 2 MG/ML IV SOLN: 2.0000 mg | INTRAVENOUS | Status: DC | PRN

## 2021-03-11 MED ORDER — ACETAMINOPHEN 500 MG PO TABS
1000.0000 mg | ORAL_TABLET | Freq: Three times a day (TID) | ORAL | Status: DC
  Administered 2021-03-11 – 2021-03-12 (×4): 1000 mg via ORAL
  Filled 2021-03-11 (×4): qty 2

## 2021-03-11 MED ORDER — ENOXAPARIN SODIUM 30 MG/0.3ML IJ SOSY
30.0000 mg | PREFILLED_SYRINGE | Freq: Two times a day (BID) | INTRAMUSCULAR | Status: DC
  Administered 2021-03-11 (×2): 30 mg via SUBCUTANEOUS
  Filled 2021-03-11 (×2): qty 0.3

## 2021-03-11 NOTE — Discharge Instructions (Signed)
You had a splenic laceration not a liver laceration.  These instructions still apply
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
## 2021-03-11 NOTE — Progress Notes (Signed)
   03/11/21 0944  Incentive Spirometry  Incentive Spirometry Goal (mL) (RN or RT) 1000 mL  Incentive Spirometry - Achieved (mL) (RN, NT, or RT) 1500 mL  Incentive Spirometry - # of Times (RN or NT) 2  Incentive Spirometry Effort (RN) Satisfactory  Incentive Spirometry Use (NT) Observed, patient had no questions

## 2021-03-11 NOTE — Progress Notes (Signed)
Orthopedic Tech Progress Note Patient Details:  Rick Livingston 22-Mar-1961 [id number]  Ortho Devices Type of Ortho Device: Shoulder immobilizer Ortho Device/Splint Location: LUE Ortho Device/Splint Interventions: Ordered, Application, Adjustment   Post Interventions Patient Tolerated: Well Instructions Provided: Care of device  Donald Pore 03/11/2021, 5:19 PM
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
## 2021-03-12 DIAGNOSIS — S62330A Displaced fracture of neck of second metacarpal bone, right hand, initial encounter for closed fracture: Secondary | ICD-10-CM | POA: Diagnosis not present

## 2021-03-12 MED ORDER — METHOCARBAMOL 750 MG PO TABS: 750.0000 mg | ORAL_TABLET | Freq: Three times a day (TID) | ORAL | 0 refills | Status: AC | PRN

## 2021-03-12 MED ORDER — INFLUENZA VAC SPLIT QUAD 0.5 ML IM SUSY
0.5000 mL | PREFILLED_SYRINGE | INTRAMUSCULAR | Status: AC
  Administered 2021-03-12: 0.5 mL via INTRAMUSCULAR

## 2021-03-12 MED ORDER — ACETAMINOPHEN 500 MG PO TABS: 1000.0000 mg | ORAL_TABLET | Freq: Four times a day (QID) | ORAL | 0 refills | Status: AC | PRN

## 2021-03-12 MED ORDER — OXYCODONE HCL 5 MG PO TABS: 5.0000 mg | ORAL_TABLET | ORAL | 0 refills | Status: AC | PRN
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
## 2021-03-12 NOTE — Progress Notes (Signed)
NURSING DC note  Patient alert and oriented. Both patient and son Rick Livingston verbalized understanding of dc instructions. Belongings and dc papers given to patient. All belongings also given to patient
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
## 2023-09-08 ENCOUNTER — Other Ambulatory Visit: Payer: Self-pay

## 2023-09-08 ENCOUNTER — Emergency Department (HOSPITAL_COMMUNITY)

## 2023-09-08 ENCOUNTER — Emergency Department (HOSPITAL_COMMUNITY)
Admission: EM | Admit: 2023-09-08 | Discharge: 2023-09-08 | Disposition: A | Discharge status: Home / Self Care | Attending: Emergency Medicine | Admitting: Emergency Medicine

## 2023-09-08 DIAGNOSIS — R600 Localized edema: Secondary | ICD-10-CM | POA: Insufficient documentation

## 2023-09-08 DIAGNOSIS — L539 Erythematous condition, unspecified: Secondary | ICD-10-CM | POA: Diagnosis not present

## 2023-09-08 DIAGNOSIS — L03115 Cellulitis of right lower limb: Secondary | ICD-10-CM | POA: Diagnosis not present

## 2023-09-08 DIAGNOSIS — L0390 Cellulitis, unspecified: Secondary | ICD-10-CM

## 2023-09-08 LAB — BASIC METABOLIC PANEL WITH GFR
Anion gap: 8 (ref 5–15)
BUN: 12 mg/dL (ref 8–23)
CO2: 25 mmol/L (ref 22–32)
Calcium: 8.6 mg/dL — ABNORMAL LOW (ref 8.9–10.3)
Chloride: 101 mmol/L (ref 98–111)
Creatinine, Ser: 0.77 mg/dL (ref 0.61–1.24)
GFR, Estimated: >60 mL/min (ref >60)
Glucose, Bld: 103 mg/dL — ABNORMAL HIGH (ref 70–99)
Potassium: 3.9 mmol/L (ref 3.5–5.1)
Sodium: 134 mmol/L — ABNORMAL LOW (ref 135–145)

## 2023-09-08 LAB — CBC WITH DIFFERENTIAL/PLATELET
Abs Immature Granulocytes: 0.01 10*3/uL (ref 0.00–0.07)
Basophils Absolute: 0 10*3/uL (ref 0.0–0.1)
Basophils Relative: 1 %
Eosinophils Absolute: 0.3 10*3/uL (ref 0.0–0.5)
Eosinophils Relative: 5 %
HCT: 39.4 % (ref 39.0–52.0)
Hemoglobin: 13.2 g/dL (ref 13.0–17.0)
Immature Granulocytes: 0 %
Lymphocytes Relative: 20 %
Lymphs Abs: 1.3 10*3/uL (ref 0.7–4.0)
MCH: 31.3 pg (ref 26.0–34.0)
MCHC: 33.5 g/dL (ref 30.0–36.0)
MCV: 93.4 fL (ref 80.0–100.0)
Monocytes Absolute: 0.7 10*3/uL (ref 0.1–1.0)
Monocytes Relative: 11 %
Neutro Abs: 3.9 10*3/uL (ref 1.7–7.7)
Neutrophils Relative %: 63 %
Platelets: 324 10*3/uL (ref 150–400)
RBC: 4.22 MIL/uL (ref 4.22–5.81)
RDW: 12.8 % (ref 11.5–15.5)
WBC: 6.2 10*3/uL (ref 4.0–10.5)
nRBC: 0 % (ref 0.0–0.2)

## 2023-09-08 MED ORDER — IOHEXOL 350 MG/ML SOLN
100.0000 mL | Freq: Once | INTRAVENOUS | Status: AC | PRN
  Administered 2023-09-08: 100 mL via INTRAVENOUS

## 2023-09-08 MED ORDER — CEPHALEXIN 500 MG PO CAPS: 500.0000 mg | ORAL_CAPSULE | Freq: Four times a day (QID) | ORAL | 0 refills | Status: AC

## 2023-09-08 MED ORDER — MORPHINE SULFATE (PF) 4 MG/ML IV SOLN
6.0000 mg | Freq: Once | INTRAVENOUS | Status: AC
  Administered 2023-09-08: 6 mg via INTRAVENOUS
  Filled 2023-09-08 (×2): qty 2

## 2023-09-08 MED ORDER — DOXYCYCLINE HYCLATE 100 MG PO CAPS: 100.0000 mg | ORAL_CAPSULE | Freq: Two times a day (BID) | ORAL | 0 refills | Status: AC

## 2023-09-08 NOTE — ED Notes (Signed)
 Patient in CT right now, will do vitals when he gets back to the room
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
## 2023-09-08 NOTE — ED Provider Notes (Addendum)
 [place] EMERGENCY DEPARTMENT AT [facility] Provider Note   CSN: [id number] Arrival date & time: 09/08/23  1645     History  Chief Complaint  Patient presents with   Cellulitis    Right leg    Rick Livingston is a 63 y.o. male.  63 year old male presents with 1 week of right lower extremity edema and erythema.  Denies any fevers or chills.  No new numbness or tingling to his right foot.  Says that he is only here because he was told to come by his home nurse.  States that the erythema has been constant and extends from the knee to the ankle of his right lower extremity.  Has not noticed any drainage.  No treatment used for this prior to arrival       Home Medications Prior to Admission medications   Medication Sig Start Date End Date Taking? Authorizing Provider  acetaminophen  (TYLENOL ) 500 MG tablet Take 2 tablets (1,000 mg total) by mouth every 6 (six) hours as needed. 03/12/21   Marlin Simmonds, PA-C  ibuprofen  (ADVIL ) 100 MG tablet Take 400-600 mg by mouth every 6 (six) hours as needed for fever.    [provider]  methocarbamol  (ROBAXIN ) 750 MG tablet Take 1 tablet (750 mg total) by mouth every 8 (eight) hours as needed for muscle spasms. 03/12/21   Marlin Simmonds, PA-C  oxyCODONE  (OXY IR/ROXICODONE ) 5 MG immediate release tablet Take 1 tablet (5 mg total) by mouth every 4 (four) hours as needed for moderate pain. 03/12/21   Marlin Simmonds, PA-C      Allergies    Chloramphenicol and Other    Review of Systems   Review of Systems  All other systems reviewed and are negative.   Physical Exam Updated Vital Signs BP (!) 146/85 (BP Location: Left Arm)   Pulse 99   Temp 97.7 F (36.5 C) (Oral)   Resp 17   Ht 1.803 m (5\' 11" )   Wt 89 kg   SpO2 98%   BMI 27.37 kg/m  Physical Exam Vitals and nursing note reviewed.  Constitutional:      General: He is not in acute distress.    Appearance: Normal appearance. He is well-developed. He is not  toxic-appearing.  HENT:     Head: Normocephalic and atraumatic.  Eyes:     General: Lids are normal.     Conjunctiva/sclera: Conjunctivae normal.     Pupils: Pupils are equal, round, and reactive to light.  Neck:     Thyroid: No thyroid mass.     Trachea: No tracheal deviation.  Cardiovascular:     Rate and Rhythm: Normal rate and regular rhythm.     Heart sounds: Normal heart sounds. No murmur heard.    No gallop.  Pulmonary:     Effort: Pulmonary effort is normal. No respiratory distress.     Breath sounds: Normal breath sounds. No stridor. No decreased breath sounds, wheezing, rhonchi or rales.  Abdominal:     General: There is no distension.     Palpations: Abdomen is soft.     Tenderness: There is no abdominal tenderness. There is no rebound.  Musculoskeletal:        General: No tenderness. Normal range of motion.     Cervical back: Normal range of motion and neck supple.       Legs:  Skin:    General: Skin is warm and dry.     Findings: No abrasion or rash.  Neurological:     Mental Status: He is alert and oriented to person, place, and time. Mental status is at baseline.     GCS: GCS eye subscore is 4. GCS verbal subscore is 5. GCS motor subscore is 6.     Cranial Nerves: No cranial nerve deficit.     Sensory: No sensory deficit.     Motor: Motor function is intact.  Psychiatric:        Attention and Perception: Attention normal.        Speech: Speech normal.        Behavior: Behavior normal.     ED Results / Procedures / Treatments   Labs (all labs ordered are listed, but only abnormal results are displayed) Labs Reviewed  CBC WITH DIFFERENTIAL/PLATELET  BASIC METABOLIC PANEL WITH GFR    EKG None  Radiology No results found.  Procedures Procedures    Medications Ordered in ED Medications - No data to display  ED Course/ Medical Decision Making/ A&P                                 Medical Decision Making Amount and/or Complexity of Data  Reviewed Labs: ordered. Radiology: ordered.  Risk Prescription drug management.   Patient's labs are reassuring here.  He does not want to wait for his results.  Suspect that he has cellulitis.  He wishes to leave AMA.  I will place patient on antibiotics for this.  I informed patient that he could have serious vascular pathology which could cause him to lose his limb.  This was explained to him in front of his significant other in the room.  He states he will come back tomorrow once he deals with his his home situation  10:30 PM Had long discussion with patient and he changes mind and decided to stay.  CT angio of extremity showed no vascular compromise.  Looks like patient has cellulitis.  Will discharge home        Final Clinical Impression(s) / ED Diagnoses Final diagnoses:  None    Rx / DC Orders ED Discharge Orders     None         Lind Repine, MD 09/08/23 2112    Lind Repine, MD 09/08/23 2230

## 2024-01-22 DIAGNOSIS — L03119 Cellulitis of unspecified part of limb: Secondary | ICD-10-CM | POA: Diagnosis not present
